# Patient Record
Sex: Male | Born: 1965 | Race: White | Hispanic: No | Marital: Married | Smoking: Never smoker
Health system: Southern US, Community
[De-identification: ages and names within clinical notes are randomized; demographics above are authoritative.]

## PROBLEM LIST (undated history)

## (undated) DIAGNOSIS — K5730 Diverticulosis of large intestine without perforation or abscess without bleeding: Secondary | ICD-10-CM

## (undated) DIAGNOSIS — K222 Esophageal obstruction: Secondary | ICD-10-CM

## (undated) DIAGNOSIS — Z8719 Personal history of other diseases of the digestive system: Secondary | ICD-10-CM

## (undated) DIAGNOSIS — K449 Diaphragmatic hernia without obstruction or gangrene: Secondary | ICD-10-CM

## (undated) DIAGNOSIS — K200 Eosinophilic esophagitis: Principal | ICD-10-CM

## (undated) DIAGNOSIS — K219 Gastro-esophageal reflux disease without esophagitis: Secondary | ICD-10-CM

## (undated) DIAGNOSIS — G43909 Migraine, unspecified, not intractable, without status migrainosus: Secondary | ICD-10-CM

## (undated) DIAGNOSIS — F411 Generalized anxiety disorder: Secondary | ICD-10-CM

## (undated) DIAGNOSIS — Z87442 Personal history of urinary calculi: Secondary | ICD-10-CM

## (undated) DIAGNOSIS — N433 Hydrocele, unspecified: Secondary | ICD-10-CM

## (undated) DIAGNOSIS — Z973 Presence of spectacles and contact lenses: Secondary | ICD-10-CM

## (undated) DIAGNOSIS — J45909 Unspecified asthma, uncomplicated: Secondary | ICD-10-CM

## (undated) HISTORY — DX: Esophageal obstruction: K22.2

## (undated) HISTORY — PX: APPENDECTOMY: SHX54

## (undated) HISTORY — PX: INGUINAL HERNIA REPAIR: SUR1180

## (undated) HISTORY — DX: Eosinophilic esophagitis: K20.0

## (undated) HISTORY — DX: Diaphragmatic hernia without obstruction or gangrene: K44.9

## (undated) HISTORY — PX: ESOPHAGEAL DILATION: SHX303

## (undated) HISTORY — PX: WISDOM TOOTH EXTRACTION: SHX21

## (undated) HISTORY — DX: Diverticulosis of large intestine without perforation or abscess without bleeding: K57.30

---

## 2005-01-29 ENCOUNTER — Ambulatory Visit (HOSPITAL_COMMUNITY): Admission: RE | Admit: 2005-01-29 | Discharge: 2005-01-29 | Payer: Self-pay | Admitting: Family Medicine

## 2005-02-24 ENCOUNTER — Ambulatory Visit (HOSPITAL_COMMUNITY): Admission: RE | Admit: 2005-02-24 | Discharge: 2005-02-24 | Payer: Self-pay | Admitting: Gastroenterology

## 2007-03-12 ENCOUNTER — Encounter: Admission: RE | Admit: 2007-03-12 | Discharge: 2007-03-12 | Payer: Self-pay | Admitting: *Deleted

## 2008-09-11 ENCOUNTER — Encounter: Admission: RE | Admit: 2008-09-11 | Discharge: 2008-09-11 | Payer: Self-pay | Admitting: Family Medicine

## 2009-07-25 ENCOUNTER — Encounter: Admission: RE | Admit: 2009-07-25 | Discharge: 2009-07-25 | Payer: Self-pay | Admitting: *Deleted

## 2009-12-22 HISTORY — PX: ESOPHAGOGASTRODUODENOSCOPY: SHX1529

## 2010-03-21 DIAGNOSIS — K200 Eosinophilic esophagitis: Secondary | ICD-10-CM | POA: Insufficient documentation

## 2010-03-21 HISTORY — DX: Eosinophilic esophagitis: K20.0

## 2010-04-15 ENCOUNTER — Emergency Department (HOSPITAL_COMMUNITY): Admission: EM | Admit: 2010-04-15 | Discharge: 2010-04-16 | Payer: Self-pay | Admitting: Emergency Medicine

## 2010-05-01 ENCOUNTER — Ambulatory Visit: Payer: Self-pay | Admitting: Otolaryngology

## 2010-05-28 ENCOUNTER — Ambulatory Visit (HOSPITAL_COMMUNITY): Admission: RE | Admit: 2010-05-28 | Discharge: 2010-05-28 | Payer: Self-pay | Admitting: Gastroenterology

## 2011-01-12 ENCOUNTER — Encounter: Payer: Self-pay | Admitting: *Deleted

## 2011-05-09 NOTE — Op Note (Signed)
NAME:  Jeffrey Henderson, Jeffrey Henderson               ACCOUNT NO.:  [id number]   MEDICAL RECORD NO.:  [id number]          PATIENT TYPE:  AMB   LOCATION:  ENDO                         FACILITY:  [facility]   PHYSICIAN:  Graylin Shiver, M.D.   DATE OF BIRTH:  Feb 02, 1966   DATE OF PROCEDURE:  02/24/2005  DATE OF DISCHARGE:                                 OPERATIVE REPORT   PROCEDURE:  Esophagogastroduodenoscopy with biopsy for CLO-test.   INDICATIONS FOR PROCEDURE:  The patient complains of heartburn and acid  reflux.   Informed consent was obtained after explanation of the risks of bleeding,  infection and perforation.   PREMEDICATION:  Fentanyl 75 mcg IV, Versed 8 mg IV.   PROCEDURE:  With the patient in the left lateral decubitus position, the  Olympus gastroscope was inserted into the oropharynx and passed into the  esophagus. It was advanced down the esophagus then into the stomach and into  the duodenum. The second portion and bulb of the duodenum looked normal. The  stomach showed some mild diffuse erythema compatible with gastritis. No  ulcers or erosions were seen. Biopsy for CLO-test was obtained. No lesions  were seen in the fundus or cardia. The esophagus revealed the  esophagogastric junction to be normal. It was located at 43 cm from the  incisor teeth. There was no evidence of erosions or ulcerations the  esophageal mucosa looked normal in its entirety. He tolerated this procedure  well without complications.   IMPRESSION:  Mild gastritis otherwise normal upper endoscopy.   I suspect that he does have some acid reflux which causes his heartburn  sensations. This seems to be responsive to the use of Pepcid [facility] which he  takes. I would recommend that he remain on this medication. The CLO test  will be checked.      SFG/MEDQ  D:  02/24/2005  T:  02/24/2005  Job:  [id number]

## 2011-05-09 NOTE — Op Note (Signed)
NAME:  Jeffrey Henderson, Jeffrey Henderson               ACCOUNT NO.:  [id number]   MEDICAL RECORD NO.:  [id number]          PATIENT TYPE:  AMB   LOCATION:  ENDO                         FACILITY:  [facility]   PHYSICIAN:  Graylin Shiver, M.D.   DATE OF BIRTH:  08-Jan-1966   DATE OF PROCEDURE:  02/24/2005  DATE OF DISCHARGE:                                 OPERATIVE REPORT   PROCEDURE:  Colonoscopy.   INDICATIONS FOR PROCEDURE:  Left upper quadrant abdominal pain described as  a crampy-like pain.  Family history of colon cancer.   CONSENT:  Informed consent was obtained after explanation of the risks of  bleeding, infection, and perforation.   PREMEDICATION:  The procedure was done after an EGD with an additional 25  mcg of fentanyl  and 2 mg of Versed given.   DESCRIPTION OF PROCEDURE:  With the patient in the left lateral decubitus  position, the rectal exam was performed.  No masses were felt.  The Olympus  colonoscope was inserted into the rectum and advanced around the colon to  the cecum.  Cecal landmarks were identified.  The colon was tortuous.  The  cecum looked normal.  The ascending colon looked normal.  The RV colon  looked normal.  The descending colon, sigmoid and rectum all looked normal.  He tolerated the procedure well without complications.   IMPRESSION:  Normal colonoscopy to the cecum.      SFG/MEDQ  D:  02/24/2005  T:  02/24/2005  Job:  [id number]   cc:   Annabell Howells, MD  Fax: [phone]

## 2011-11-28 IMAGING — CR DG CHEST 1V PORT
1 series · 1 of 1 positions shown · non-contrast
Comparison: none

REASON FOR EXAM: fb
COMMENTS:

PROCEDURE:     DXR - DXR PORTABLE CHEST SINGLE VIEW  - May 01, 2010  [DATE]
RESULT:     The lung fields are clear. The heart, mediastinal and osseous
structures are normal in appearance.

[view not recorded]
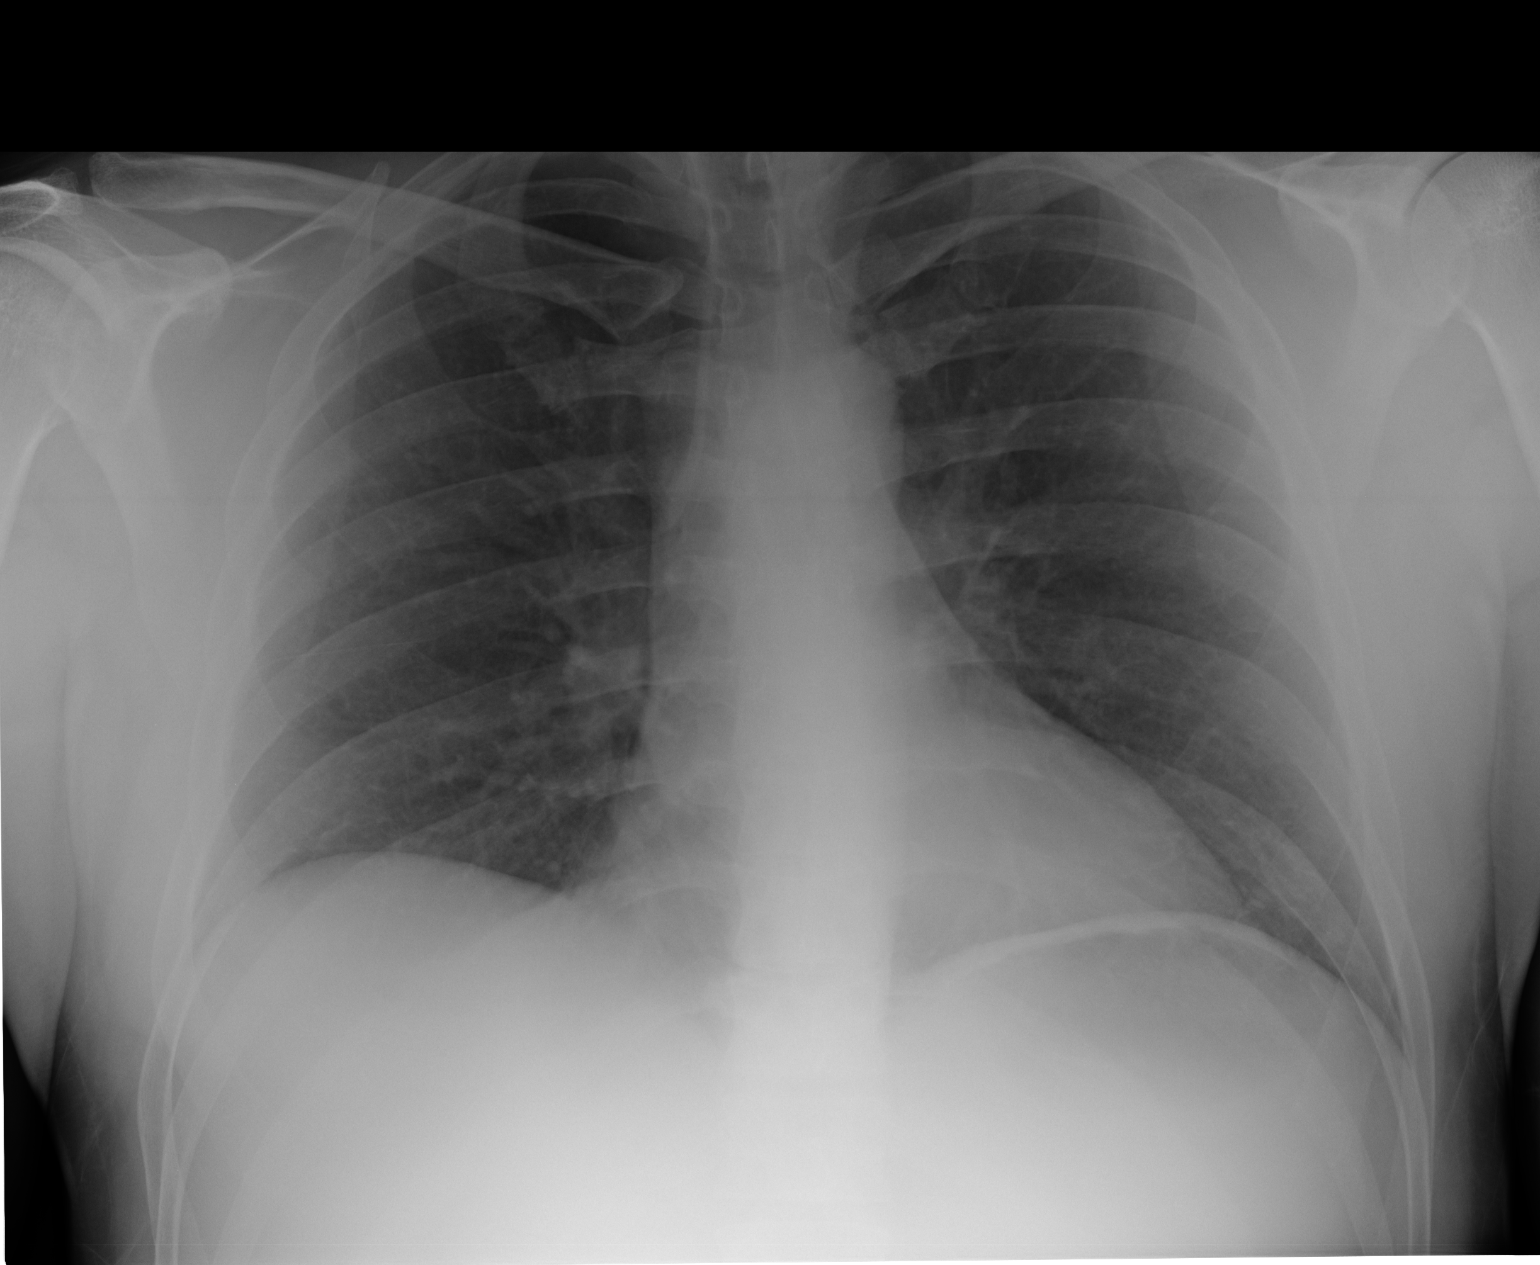

[1 of 1 positions shown; findings below may reference images not displayed]

IMPRESSION: No acute changes are identified.

## 2014-01-21 ENCOUNTER — Emergency Department (HOSPITAL_COMMUNITY): Payer: BC Managed Care – PPO

## 2014-01-21 ENCOUNTER — Emergency Department (HOSPITAL_COMMUNITY)
Admission: EM | Admit: 2014-01-21 | Discharge: 2014-01-21 | Disposition: A | Discharge status: Home / Self Care | Payer: BC Managed Care – PPO | Attending: Emergency Medicine | Admitting: Emergency Medicine

## 2014-01-21 ENCOUNTER — Encounter (HOSPITAL_COMMUNITY): Payer: Self-pay | Admitting: Emergency Medicine

## 2014-01-21 DIAGNOSIS — S139XXA Sprain of joints and ligaments of unspecified parts of neck, initial encounter: Secondary | ICD-10-CM | POA: Insufficient documentation

## 2014-01-21 DIAGNOSIS — Y9389 Activity, other specified: Secondary | ICD-10-CM | POA: Insufficient documentation

## 2014-01-21 DIAGNOSIS — IMO0002 Reserved for concepts with insufficient information to code with codable children: Secondary | ICD-10-CM

## 2014-01-21 DIAGNOSIS — S239XXA Sprain of unspecified parts of thorax, initial encounter: Secondary | ICD-10-CM | POA: Insufficient documentation

## 2014-01-21 DIAGNOSIS — Z79899 Other long term (current) drug therapy: Secondary | ICD-10-CM | POA: Insufficient documentation

## 2014-01-21 DIAGNOSIS — Y92410 Unspecified street and highway as the place of occurrence of the external cause: Secondary | ICD-10-CM | POA: Insufficient documentation

## 2014-01-21 DIAGNOSIS — S161XXA Strain of muscle, fascia and tendon at neck level, initial encounter: Secondary | ICD-10-CM

## 2014-01-21 MED ORDER — METHOCARBAMOL 500 MG PO TABS: 500.0000 mg | ORAL_TABLET | Freq: Two times a day (BID) | ORAL | Status: DC

## 2014-01-21 MED ORDER — HYDROCODONE-ACETAMINOPHEN 5-325 MG PO TABS: 1.0000 | ORAL_TABLET | ORAL | Status: DC | PRN

## 2014-01-21 MED ORDER — MORPHINE SULFATE 4 MG/ML IJ SOLN
4.0000 mg | Freq: Once | INTRAMUSCULAR | Status: AC
  Administered 2014-01-21: 4 mg via INTRAMUSCULAR
  Filled 2014-01-21: qty 1

## 2014-01-21 MED ORDER — KETOROLAC TROMETHAMINE 60 MG/2ML IM SOLN
60.0000 mg | Freq: Once | INTRAMUSCULAR | Status: AC
  Administered 2014-01-21: 60 mg via INTRAMUSCULAR
  Filled 2014-01-21: qty 2

## 2014-01-21 MED ORDER — DIAZEPAM 5 MG/ML IJ SOLN
5.0000 mg | Freq: Once | INTRAMUSCULAR | Status: AC
  Administered 2014-01-21: 5 mg via INTRAMUSCULAR
  Filled 2014-01-21: qty 2
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
## 2014-01-21 NOTE — Discharge Instructions (Signed)
1. Medications: robaxin, naproxyn or ibuprofen, vicodin, usual home medications 2. Treatment: rest, drink plenty of fluids, gentle stretching as discussed, alternate ice and heat 3. Follow Up: Please followup with your primary doctor for discussion of your diagnoses and further evaluation after today's visit; if you do not have a primary care doctor use the resource guide provided to find one;   Back Exercises Back exercises help treat and prevent back injuries. The goal of back exercises is to increase the strength of your abdominal and back muscles and the flexibility of your back. These exercises should be started when you no longer have back pain. Back exercises include:  Pelvic Tilt. Lie on your back with your knees bent. Tilt your pelvis until the lower part of your back is against the floor. Hold this position 5 to 10 sec and repeat 5 to 10 times.  Knee to Chest. Pull first 1 knee up against your chest and hold for 20 to 30 seconds, repeat this with the other knee, and then both knees. This may be done with the other leg straight or bent, whichever feels better.  Sit-Ups or Curl-Ups. Bend your knees 90 degrees. Start with tilting your pelvis, and do a partial, slow sit-up, lifting your trunk only 30 to 45 degrees off the floor. Take at least 2 to 3 seconds for each sit-up. Do not do sit-ups with your knees out straight. If partial sit-ups are difficult, simply do the above but with only tightening your abdominal muscles and holding it as directed.  Hip-Lift. Lie on your back with your knees flexed 90 degrees. Push down with your feet and shoulders as you raise your hips a couple inches off the floor; hold for 10 seconds, repeat 5 to 10 times.  Back arches. Lie on your stomach, propping yourself up on bent elbows. Slowly press on your hands, causing an arch in your low back. Repeat 3 to 5 times. Any initial stiffness and discomfort should lessen with repetition over time.  Shoulder-Lifts. Lie  face down with arms beside your body. Keep hips and torso pressed to floor as you slowly lift your head and shoulders off the floor. Do not overdo your exercises, especially in the beginning. Exercises may cause you some mild back discomfort which lasts for a few minutes; however, if the pain is more severe, or lasts for more than 15 minutes, do not continue exercises until you see your caregiver. Improvement with exercise therapy for back problems is slow.  See your caregivers for assistance with developing a proper back exercise program. Document Released: 01/15/2005 Document Revised: 03/01/2012 Document Reviewed: 10/09/2011 [facility] Patient Information 2014 [place], [place].    Emergency Department Resource Guide 1) Find a Doctor and Pay Out of Pocket Although you won't have to find out who is covered by your insurance plan, it is a good idea to ask around and get recommendations. You will then need to call the office and see if the doctor you have chosen will accept you as a new patient and what types of options they offer for patients who are self-pay. Some doctors offer discounts or will set up payment plans for their patients who do not have insurance, but you will need to ask so you aren't surprised when you get to your appointment.  2) Contact Your Local Health Department Not all health departments have doctors that can see patients for sick visits, but many do, so it is worth a call to see if yours does. If  you don't know where your local health department is, you can check in your phone book. The CDC also has a tool to help you locate your state's health department, and many state websites also have listings of all of their local health departments.  3) Find a Walk-in Clinic If your illness is not likely to be very severe or complicated, you may want to try a walk in clinic. These are popping up all over the country in pharmacies, drugstores, and shopping centers. They're usually staffed by  nurse practitioners or physician assistants that have been trained to treat common illnesses and complaints. They're usually fairly quick and inexpensive. However, if you have serious medical issues or chronic medical problems, these are probably not your best option.  No Primary Care Doctor: - Call Health Connect at  [phone] - they can help you locate a primary care doctor that  accepts your insurance, provides certain services, etc. - Physician Referral Service- [phone]  Chronic Pain Problems: Organization         Address  Phone   Notes  [facility]  [phone] Patients need to be referred by their primary care doctor.   Medication Assistance: Organization         Address  Phone   Notes  [facility] [address] [place], [place] [address] [phone] --Must be a resident of [facility] [facility] -- Must have NO insurance coverage whatsoever (no Medicaid/ Medicare, etc.) -- The pt. MUST have a primary care doctor that directs their care regularly and follows them in the community   [facility]ist  [phone]   [employer]  [phone]    Agencies that provide inexpensive medical care: Organization         Address  Phone   Notes  [facility]  [phone]) [phone]   [facility]    [phone]   [facility] [address] [place], [place] [address] [phone]) [phone]   [facility] [address], [place] [phone]) [phone]   [facility]anned Parenthood    [phone]   [facility]    [phone]   [facility]  [address], [place] Phone:  [phone], Fax:  [phone]) [phone] Hours of Operation:  9 am - 6 pm, M-F.  Also accepts Medicaid/Medicare and self-pay.  [facility]  [address], [place] Phone: [phone], Fax: [phone]. Hours of Operation:  8:30  am - 5:30 pm, M-F.  Also accepts Medicaid and self-pay.  [facility] [place] [address], [place] Phone: [phone]) [phone]   [facility] [address], [place], [place] [phone], Ext. [phone] Mondays & Thursdays: 7-9 AM.  First 15 patients are seen on a first come, first serve basis.    Medicaid-accepting [facility] Providers:  Organization         Address  Phone   Notes  [facility] [address], [place] [phone] Also accepts self-pay patients.  [facility] [address], [place]  [phone]) [phone]   [facility] [address], [place] [phone]   [facility] [address] [place] [address], [place] [phone]   Renaye RakersVeita Bland [address], [place]   [phone] Only accepts  [place] Access Medicaid patients after they have their name applied to their card.   Self-Pay (no insurance) in [facility]:  Organization         Address  Phone   Notes  Sickle Cell Patients, [facility] [address] [place], [place] [phone]   [facility] [address] [place], [place] [phone]   [facility]  [address], [place] [phone]   [facility]/Dr. Osei-Bonsu  [address] [place] or [address], [place] [phone] Phone number for both [place] and [place] locations is the same.  [facility] [address] [place] [phone]   [facility] [address], [place] or [address] [phone]   [facility] [address], [place] [phone], phone; [phone], fax Sees patients 1st and 3rd Saturday of every month.  Must not qualify for public or private insurance (i.e. Medicaid, Medicare, [facility]  Choice, Veterans' Benefits)  Household income should be no more than 200% of the poverty level The clinic cannot treat you if you are pregnant or think you are pregnant  Sexually transmitted diseases are not treated at the clinic.    Dental Care: Organization         Address  Phone  Notes  [facility] Department of [facility] [address] [place], [place] [phone] Accepts children up to age 44 who are enrolled in [place] or [place] Health Choice; pregnant women with a Medicaid card; and children who have applied for Medicaid or [place] Health Choice, but were declined, whose parents can pay a reduced fee at time of service.  [facility]  [address], [place] [phone] Accepts children up to age 33 who are enrolled in [place] or [place] Health Choice; pregnant women with a Medicaid card; and children who have applied for Medicaid or [place] Health Choice, but were declined, whose parents can pay a reduced fee at time of service.  [facility]  [address] [place], [place] [phone] Patients are seen by appointment only. Walk-ins are not accepted. [facility] will see patients 85 years of age and older. Monday - Tuesday (8am-5pm) Most Wednesdays (8:30-5pm) $30 per visit, cash only  [facility]  [address], [facility] [phone] Patients are seen by appointment only. Walk-ins are not accepted. [facility] will see patients 40 years of age and older. One Wednesday Evening (Monthly: Volunteer Based).  $30 per visit, cash only  [employer]  [phone] for adults; Children under age 35, call Graduate Pediatric Dentistry at [phone]. Children aged 53-14, please call [phone] to request a pediatric application.  Dental services are provided in all areas of dental care including fillings, crowns and bridges, complete  and partial dentures, implants, gum treatment, root canals, and extractions. Preventive care is also provided. Treatment is provided to both adults and children. Patients are selected via a lottery and there is often a waiting list.   [facility] [address] [place]  [phone] [email or web address]   [facility] [address] [place], [place] [phone], Ext. [phone] Second and Fourth Thursday of each month, opens at 6:30 AM; Clinic ends at  AM.  Patients are seen on a first-come first-served basis, and a limited number are seen during each clinic.  ° °[facility] ° [address], [place], [place] [phone]   Eligibility Requirements °You must have lived in [place], [place], or [place] counties for at least the last three months. °  You cannot be eligible for state or federal sponsored healthcare insurance, including Veterans Administration, Medicaid, or Medicare. °  You generally cannot be eligible for healthcare insurance through your employer.  °  How to apply: °Eligibility screenings are held every Tuesday and Wednesday afternoon from 1:00 pm until 4:00 pm. You do not need an appointment for the interview!  °[facility] [address], [place], [place] [phone]   °[facility]  [phone]   °[facility]  336-703-3100   °[facility]  336-570-6415   ° °Behavioral Health Resources in the Community: °Intensive Outpatient Programs °Organization         Address  Phone  Notes  °[facility] [address], [place], [place] [phone]   °[facility] [address], [place], [place] [phone]   °[facility] [address], [place], [place] ° 336-882-2125   °[facility] [address],  °[place], [place] 1-800-853-5163 or 336-641-4981   °Substance Abuse Resources °Organization          Address  Phone  Notes  °[facility]  336-882-2125   °[facility]  336-784-9470   °[facility]  336-285-9073   °[facility]  336-845-3988   °Residential & Outpatient Substance Abuse Program  1-800-659-3381   °Psychological Services °Organization         Address  Phone  Notes  °[facility]  336- 832-9600   °[facility]  336- 378-7881   °[facility] [address], [place] 1-800-853-5163 or 336-641-4981   ° °Mobile Crisis Teams °Organization         Address  Phone  Notes  °[facility], Mobile Crisis Care Unit  1-877-626-1772   °Assertive °[facility]otherapeutic Services ° [address] [place], [place] 336-834-9664   °Sharon DeEsch [address] °[place] 336-554-5454   ° °Self-Help/Support Groups °Organization         Address  Phone             Notes  °[facility] [place] - variety of support groups  336- 373-1402 Call for more information  °Narcotics Anonymous (NA), [facility] [address], °[place]  2 meetings at this location  ° °Residential Treatment Programs °Organization         Address  Phone  Notes  °[facility] [address],    °[place]  1-866-801-8205   °[facility] ° [address], [place], [place] 704-293-8524   °[facility] [address], [place] 336-845-3988 Admissions: 8am-3pm M-F  °[facility] [address]    °[place], [place] 336-841-1104   °[facility] [address], [place], [place] 336-379-7146   °[facility] [address]  °[place], [place] 336-285-9073   °[facility] - Intensive Outpatient [address], [place], [place] 336-852-3033   °[facility] ([facility] [address]  °[place], [place] 1-877-615-2722 or 336-784-9470   °[facility] ([facility])   [facility]) [address] [place], [place]  [phone] Accepts Medicaid  [facility] [address]  [place] [phone] Substance Abuse/Addiction Treatment   [facility]al Health Resources Organization         Address  Phone  Notes  [facility]  [phone]   Angie FavaJulie Brannon, PhD [address], [address] [place], [place]   [phone]) [phone] or [phone](336) [phone]   [facility]   [address] [place], [place] [phone](336) [phone]   [facility] [address], [place], [place] [phone] Insurance/Medicaid/sponsorship through [facility]  [facility] [address]                                    [place], [place] [phone] Therapy/tele-psych/case  [facility] [address]   [place], [place] [phone](336) [phone]    Dr. Lolly MustacheArfeen  [phone]   [facility]  [facility] 1) [address], [place] 2) [address], [place] 3)  [address], [place] [phone]  [phone]   [facility] Child Abuse Hotline [phone](336) [phone] or [phone](336) [phone] (After Hours)

## 2014-01-22 NOTE — ED Provider Notes (Signed)
Medical screening examination/treatment/procedure(s) were performed by non-physician practitioner and as supervising physician I was immediately available for consultation/collaboration.  EKG Interpretation   None         Lyanne CoKevin M Zully Frane, MD 01/22/14 [phone]

## 2015-02-05 ENCOUNTER — Ambulatory Visit (HOSPITAL_BASED_OUTPATIENT_CLINIC_OR_DEPARTMENT_OTHER)
Admission: RE | Admit: 2015-02-05 | Discharge: 2015-02-05 | Disposition: A | Discharge status: Home / Self Care | Payer: BLUE CROSS/BLUE SHIELD | Source: Ambulatory Visit | Attending: Family Medicine | Admitting: Family Medicine

## 2015-02-05 ENCOUNTER — Ambulatory Visit: Payer: Self-pay | Admitting: Sports Medicine

## 2015-02-05 ENCOUNTER — Encounter: Payer: Self-pay | Admitting: Family Medicine

## 2015-02-05 ENCOUNTER — Ambulatory Visit (INDEPENDENT_AMBULATORY_CARE_PROVIDER_SITE_OTHER): Payer: BLUE CROSS/BLUE SHIELD | Admitting: Family Medicine

## 2015-02-05 VITALS — BP 134/83 | HR 89 | Ht 71.0 in | Wt 190.0 lb

## 2015-02-05 DIAGNOSIS — M545 Low back pain, unspecified: Secondary | ICD-10-CM

## 2015-02-05 DIAGNOSIS — M542 Cervicalgia: Secondary | ICD-10-CM

## 2015-02-05 MED ORDER — PREDNISONE (PAK) 10 MG PO TABS: ORAL_TABLET | ORAL | Status: DC

## 2015-02-05 MED ORDER — TRAMADOL HCL 50 MG PO TABS: 50.0000 mg | ORAL_TABLET | Freq: Four times a day (QID) | ORAL | Status: DC | PRN

## 2015-02-05 NOTE — Patient Instructions (Signed)
Get x-rays as you leave today. We will contact you with these results and try to get an MRI approved for your low back. A prednisone dose pack is the best option for immediate relief and may be prescribed. Day after finishing prednisone start aleve 2 tabs a day with food for pain and inflammation. Tramadol as needed for severe pain (no driving on this medicine). Stay as active as possible. Physical therapy has been shown to be helpful as well. Strengthening of low back muscles, abdominal musculature are key for long term pain relief.

## 2015-02-07 DIAGNOSIS — M545 Low back pain, unspecified: Secondary | ICD-10-CM | POA: Insufficient documentation

## 2015-02-07 DIAGNOSIS — M542 Cervicalgia: Secondary | ICD-10-CM | POA: Insufficient documentation
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
## 2015-02-07 NOTE — Assessment & Plan Note (Signed)
2/2 cervical strain.  Discussed options and he would like to try prednisone then switch to nsaids.  Tramadol as needed.  Consider physical therapy.

## 2015-02-07 NOTE — Assessment & Plan Note (Signed)
worse than his neck pain.  Radiographs negative.  Definitely has lumbar strain component but concern for disc herniation given severity.  He would like to go ahead with MRI and possibly ESI if herniation seen on MRI coupled with physical therapy

## 2015-02-10 ENCOUNTER — Ambulatory Visit (HOSPITAL_BASED_OUTPATIENT_CLINIC_OR_DEPARTMENT_OTHER): Payer: BLUE CROSS/BLUE SHIELD

## 2015-07-11 ENCOUNTER — Other Ambulatory Visit: Payer: Self-pay | Admitting: Family

## 2015-07-11 DIAGNOSIS — R1012 Left upper quadrant pain: Secondary | ICD-10-CM

## 2015-07-17 ENCOUNTER — Ambulatory Visit
Admission: RE | Admit: 2015-07-17 | Discharge: 2015-07-17 | Disposition: A | Discharge status: Home / Self Care | Payer: BLUE CROSS/BLUE SHIELD | Source: Ambulatory Visit | Attending: Family | Admitting: Family

## 2015-07-17 ENCOUNTER — Other Ambulatory Visit: Payer: Self-pay | Admitting: Family

## 2015-07-17 DIAGNOSIS — R1012 Left upper quadrant pain: Secondary | ICD-10-CM

## 2016-01-15 ENCOUNTER — Other Ambulatory Visit: Payer: Self-pay | Admitting: Surgery

## 2016-01-15 DIAGNOSIS — R1032 Left lower quadrant pain: Secondary | ICD-10-CM

## 2016-01-21 ENCOUNTER — Other Ambulatory Visit: Payer: BLUE CROSS/BLUE SHIELD

## 2016-01-24 ENCOUNTER — Ambulatory Visit
Admission: RE | Admit: 2016-01-24 | Discharge: 2016-01-24 | Disposition: A | Discharge status: Home / Self Care | Payer: BLUE CROSS/BLUE SHIELD | Source: Ambulatory Visit | Attending: Surgery | Admitting: Surgery

## 2016-01-24 DIAGNOSIS — R1032 Left lower quadrant pain: Secondary | ICD-10-CM

## 2016-01-24 MED ORDER — IOPAMIDOL (ISOVUE-300) INJECTION 61%
100.0000 mL | Freq: Once | INTRAVENOUS | Status: AC | PRN
Start: 2016-01-24 — End: 2016-01-24
  Administered 2016-01-24: 100 mL via INTRAVENOUS

## 2016-03-17 ENCOUNTER — Other Ambulatory Visit: Payer: Self-pay | Admitting: Surgery

## 2016-03-17 DIAGNOSIS — K5732 Diverticulitis of large intestine without perforation or abscess without bleeding: Secondary | ICD-10-CM

## 2016-03-19 ENCOUNTER — Ambulatory Visit
Admission: RE | Admit: 2016-03-19 | Discharge: 2016-03-19 | Disposition: A | Discharge status: Home / Self Care | Payer: BLUE CROSS/BLUE SHIELD | Source: Ambulatory Visit | Attending: Surgery | Admitting: Surgery

## 2016-03-19 DIAGNOSIS — K5732 Diverticulitis of large intestine without perforation or abscess without bleeding: Secondary | ICD-10-CM

## 2016-03-19 MED ORDER — IOPAMIDOL (ISOVUE-300) INJECTION 61%
100.0000 mL | Freq: Once | INTRAVENOUS | Status: AC | PRN
  Administered 2016-03-19: 100 mL via INTRAVENOUS

## 2016-03-21 ENCOUNTER — Other Ambulatory Visit (HOSPITAL_COMMUNITY): Payer: Self-pay | Admitting: Surgery

## 2016-03-21 ENCOUNTER — Other Ambulatory Visit: Payer: Self-pay | Admitting: Surgery

## 2016-03-21 ENCOUNTER — Encounter: Payer: Self-pay | Admitting: Surgery

## 2016-03-21 DIAGNOSIS — K222 Esophageal obstruction: Secondary | ICD-10-CM

## 2016-03-21 DIAGNOSIS — K5730 Diverticulosis of large intestine without perforation or abscess without bleeding: Secondary | ICD-10-CM | POA: Insufficient documentation

## 2016-03-21 DIAGNOSIS — K200 Eosinophilic esophagitis: Secondary | ICD-10-CM

## 2016-03-21 DIAGNOSIS — K449 Diaphragmatic hernia without obstruction or gangrene: Secondary | ICD-10-CM

## 2016-03-21 DIAGNOSIS — R10814 Left lower quadrant abdominal tenderness: Secondary | ICD-10-CM

## 2016-03-21 HISTORY — DX: Esophageal obstruction: K22.2

## 2016-03-21 HISTORY — DX: Diverticulosis of large intestine without perforation or abscess without bleeding: K57.30

## 2016-03-21 HISTORY — DX: Diaphragmatic hernia without obstruction or gangrene: K44.9

## 2016-05-13 ENCOUNTER — Other Ambulatory Visit: Payer: Self-pay | Admitting: Family Medicine

## 2016-05-13 ENCOUNTER — Ambulatory Visit
Admission: RE | Admit: 2016-05-13 | Discharge: 2016-05-13 | Disposition: A | Discharge status: Home / Self Care | Payer: BLUE CROSS/BLUE SHIELD | Source: Ambulatory Visit | Attending: Family Medicine | Admitting: Family Medicine

## 2016-05-13 DIAGNOSIS — M549 Dorsalgia, unspecified: Secondary | ICD-10-CM

## 2016-05-13 DIAGNOSIS — M542 Cervicalgia: Secondary | ICD-10-CM

## 2016-05-16 ENCOUNTER — Other Ambulatory Visit: Payer: Self-pay | Admitting: Family Medicine

## 2016-05-16 DIAGNOSIS — M546 Pain in thoracic spine: Secondary | ICD-10-CM

## 2016-05-16 DIAGNOSIS — M542 Cervicalgia: Secondary | ICD-10-CM

## 2016-05-22 ENCOUNTER — Other Ambulatory Visit: Payer: BLUE CROSS/BLUE SHIELD

## 2017-06-21 DIAGNOSIS — K7689 Other specified diseases of liver: Secondary | ICD-10-CM

## 2017-06-21 HISTORY — DX: Other specified diseases of liver: K76.89

## 2018-07-03 ENCOUNTER — Emergency Department (HOSPITAL_COMMUNITY)
Admission: EM | Admit: 2018-07-03 | Discharge: 2018-07-03 | Disposition: A | Discharge status: Home / Self Care | Payer: BLUE CROSS/BLUE SHIELD | Attending: Emergency Medicine | Admitting: Emergency Medicine

## 2018-07-03 ENCOUNTER — Other Ambulatory Visit: Payer: Self-pay

## 2018-07-03 ENCOUNTER — Emergency Department (HOSPITAL_COMMUNITY): Payer: BLUE CROSS/BLUE SHIELD

## 2018-07-03 ENCOUNTER — Encounter (HOSPITAL_COMMUNITY): Payer: Self-pay

## 2018-07-03 DIAGNOSIS — Z87891 Personal history of nicotine dependence: Secondary | ICD-10-CM | POA: Insufficient documentation

## 2018-07-03 DIAGNOSIS — Z79899 Other long term (current) drug therapy: Secondary | ICD-10-CM | POA: Insufficient documentation

## 2018-07-03 DIAGNOSIS — K5900 Constipation, unspecified: Secondary | ICD-10-CM

## 2018-07-03 NOTE — Discharge Instructions (Signed)
It was our pleasure to provide your ER care today - we hope that you feel better.  Drink plenty of fluids. Get plenty of fiber in diet. Take colace (stool softener) 2x/day, and use miralax (laxative) once daily as need  - both of these two medications are available over the counter.   Follow up with primary care doctor Monday if symptoms fail to improve/resolve.  Return to ER if worse, new symptoms, fevers, worsening or severe abdominal pani, persistent vomiting, other concern.

## 2018-07-03 NOTE — ED Provider Notes (Signed)
[facility]-EMERGENCY DEPT Provider Note   CSN: [id number] Arrival date & time: 07/03/18  1220     History   Chief Complaint Chief Complaint  Patient presents with  . Constipation    HPI Alex GardenerBrian K Dubray is a 52 y.o. male.  Patient c/o constipation in the past several days. Has had small bm yesterday, but no good/normal bm for 4 days. Symptoms moderate, persistent.  No abd distension. No vomiting. Is passing gas. Only prior abd surgery is remote hx appendectomy. Feels as if has to have bm, but denies focal or continuous abd pain. Normal appetite.   The history is provided by the patient.  Constipation   Pertinent negatives include no dysuria.    Past Medical History:  Diagnosis Date  . Diverticulosis of sigmoid colon 03/21/2016  . Eosinophilic esophagitis 03/21/2010   [facility].  Treated with oral fluticasone therapy     . Hiatal hernia 03/21/2016  . Schatzki's ring of distal esophagus 03/21/2016    Patient Active Problem List   Diagnosis Date Noted  . Hiatal hernia 03/21/2016  . Schatzki's ring of distal esophagus 03/21/2016  . Diverticulosis of sigmoid colon 03/21/2016  . Low back pain 02/07/2015  . Neck pain 02/07/2015  . Eosinophilic esophagitis 03/21/2010    Past Surgical History:  Procedure Laterality Date  . APPENDECTOMY          Home Medications    Prior to Admission medications   Medication Sig Start Date End Date Taking? Authorizing Provider  LORazepam (ATIVAN) 1 MG tablet TAKE ONE TABLET (1 MG DOSE) BY MOUTH DAILY AS NEEDED FOR ANXIETY. 06/10/18  Yes [provider]  omeprazole (PRILOSEC) 20 MG capsule Take 20 mg by mouth daily.   Yes [provider]  Probiotic Product (PROBIOTIC PO) Take 1 capsule by mouth daily.   Yes [provider]  traZODone (DESYREL) 100 MG tablet TAKE 1 TABLET BY MOUTH EVERYDAY AT BEDTIME 04/09/18  Yes [provider]  vitamin B-12 (CYANOCOBALAMIN) 1000 MCG tablet Take 1,000 mcg  by mouth daily.   Yes [provider]  predniSONE (STERAPRED UNI-PAK) 10 MG tablet 6 tabs po day 1, 5 tabs po day 2, 4 tabs po day 3, 3 tabs po day 4, 2 tabs po day 5, 1 tab po day 6 Patient not taking: Reported on 07/03/2018 02/05/15   Lenda KelpHudnall, Shane R, MD  traMADol (ULTRAM) 50 MG tablet Take 1 tablet (50 mg total) by mouth every 6 (six) hours as needed. Patient not taking: Reported on 07/03/2018 02/05/15   Lenda KelpHudnall, Shane R, MD    Family History No family history on file.  Social History Social History   Tobacco Use  . Smoking status: Never Smoker  . Smokeless tobacco: Former Engineer, waterUser  Substance Use Topics  . Alcohol use: Yes    Comment: social  . Drug use: No     Allergies   Codeine   Review of Systems Review of Systems  Constitutional: Negative for fever.  HENT: Negative for sore throat.   Respiratory: Negative for cough.   Cardiovascular: Negative for chest pain.  Gastrointestinal: Positive for constipation. Negative for vomiting.  Genitourinary: Negative for dysuria and flank pain.     Physical Exam Updated Vital Signs BP (!) 143/84 (BP Location: Left Arm)   Pulse 83   Resp 18   Ht 1.803 m (5\' 11" )   Wt 90.7 kg (200 lb)   SpO2 96%   BMI 27.89 kg/m   Physical Exam  Constitutional: He is oriented to person, place, and time. He appears well-developed and well-nourished.  HENT:  Mouth/Throat: Oropharynx is clear and moist.  Eyes: Conjunctivae are normal.  Neck: Neck supple. No tracheal deviation present.  Cardiovascular: Normal rate, regular rhythm, normal heart sounds and intact distal pulses. Exam reveals no gallop and no friction rub.  No murmur heard. Pulmonary/Chest: Effort normal and breath sounds normal. No accessory muscle usage. No respiratory distress.  Abdominal: Soft. Bowel sounds are normal. He exhibits no distension and no mass. There is no tenderness. There is no rebound and no guarding. No hernia.  Genitourinary:  Genitourinary Comments:  No cva tenderness  Musculoskeletal: He exhibits no edema.  Neurological: He is alert and oriented to person, place, and time.  Skin: Skin is warm and dry.  Psychiatric: He has a normal mood and affect.  Nursing note and vitals reviewed.    ED Treatments / Results  Labs (all labs ordered are listed, but only abnormal results are displayed) Labs Reviewed - No data to display  EKG None  Radiology Dg Abd 1 View  Result Date: 07/03/2018 CLINICAL DATA:  52 year old male with history of constipation for the past 3-4 days. Increasing abdominal pain and nausea. EXAM: ABDOMEN - 1 VIEW COMPARISON:  No priors. FINDINGS: Gas and stool are seen scattered throughout the colon extending to the level of the distal rectum. No pathologic distension of small bowel is noted. No gross evidence of pneumoperitoneum. Surgical clips in the right lower quadrant likely from prior appendectomy. IMPRESSION: 1. Nonobstructive bowel gas pattern. 2. No pneumoperitoneum. Electronically Signed   By: Trudie Reed M.D.   On: 07/03/2018 14:15    Procedures Procedures (including critical care time)  Medications Ordered in ED Medications - No data to display   Initial Impression / Assessment and Plan / ED Course  I have reviewed the triage vital signs and the nursing notes.  Pertinent labs & imaging results that were available during my care of the patient were reviewed by me and considered in my medical decision making (see chart for details).  xrays ordered.  Reviewed nursing notes and prior charts for additional history.   No vomiting. No fevers. abd exam benign. xrays reviewed - no obstruction.  Will rec colace and miralax.   Return precautions provided.     Final Clinical Impressions(s) / ED Diagnoses   Final diagnoses:  None    ED Discharge Orders    None       Cathren Laine, MD 07/03/18 [phone]

## 2018-07-03 NOTE — ED Triage Notes (Signed)
Pt has not had bm since Tuesday.  Has tried home remedies without relief.  Extreme abdominal pain.  Hx of diverticulitis.  Nausea with no vomiting.

## 2019-04-12 ENCOUNTER — Other Ambulatory Visit: Payer: Self-pay | Admitting: Urology

## 2019-05-05 ENCOUNTER — Other Ambulatory Visit: Payer: Self-pay | Admitting: Urology

## 2019-05-21 ENCOUNTER — Other Ambulatory Visit: Payer: Self-pay

## 2019-05-21 ENCOUNTER — Encounter (HOSPITAL_BASED_OUTPATIENT_CLINIC_OR_DEPARTMENT_OTHER): Payer: Self-pay

## 2019-05-21 NOTE — Progress Notes (Signed)
Spoke with:  Arlys John NPO:  After Midnight, no gum, candy, or mints   Arrival time:  0715AM Labs: None (COVID 05/23/2019 in epic) AM medications: Omeprazole, Lorazepam if needed Pre op orders: Yes Ride home: Aram Beecham (wife) [phone]

## 2019-05-21 NOTE — Progress Notes (Signed)
SPOKE W/  Shon     SCREENING SYMPTOMS OF COVID 19:   COUGH--NO  RUNNY NOSE--- NO  SORE THROAT---NO  NASAL CONGESTION----NO  SNEEZING----NO  SHORTNESS OF BREATH---NO  DIFFICULTY BREATHING---NO  TEMP >100.0 -----NO  UNEXPLAINED BODY ACHES------NO  CHILLS -------- NO  HEADACHES ---------NO  LOSS OF SMELL/ TASTE --------NO    HAVE YOU OR ANY FAMILY MEMBER TRAVELLED PAST 14 DAYS OUT OF THE   COUNTY---NO STATE----NO COUNTRY----NO  HAVE YOU OR ANY FAMILY MEMBER BEEN EXPOSED TO ANYONE WITH COVID 19? NO    

## 2019-05-23 ENCOUNTER — Other Ambulatory Visit (HOSPITAL_COMMUNITY)
Admission: RE | Admit: 2019-05-23 | Discharge: 2019-05-23 | Disposition: A | Discharge status: Home / Self Care | Payer: BC Managed Care – PPO | Source: Ambulatory Visit | Attending: Urology | Admitting: Urology

## 2019-05-23 ENCOUNTER — Other Ambulatory Visit: Payer: Self-pay

## 2019-05-23 DIAGNOSIS — Z1159 Encounter for screening for other viral diseases: Secondary | ICD-10-CM | POA: Diagnosis present

## 2019-05-25 NOTE — Progress Notes (Signed)
SPOKE W/ PATIENT.     SCREENING SYMPTOMS OF COVID 19:   COUGH-- NO  RUNNY NOSE--- NO  SORE THROAT--- NO  NASAL CONGESTION---- NO  SNEEZING---- NO  SHORTNESS OF BREATH--- NO  DIFFICULTY BREATHING--- NO  TEMP >100.0 ----- NO  UNEXPLAINED BODY ACHES------ NO  CHILLS -------- NO   HEADACHES --------- NO  LOSS OF SMELL/ TASTE -------- NO    HAVE YOU OR ANY FAMILY MEMBER TRAVELLED PAST 14 DAYS OUT OF THE   COUNTY--- NO STATE---- NO COUNTRY---- NO  HAVE YOU OR ANY FAMILY MEMBER BEEN EXPOSED TO ANYONE WITH COVID 19? NO  Cheston Coury J Verlin Duke, RN     

## 2019-05-25 NOTE — H&P (Signed)
CC: I have swelling in my scrotum.  HPI: Jeffrey Henderson is a 53 year-old male established patient who is here for scrotal swelling.  His hydrocele is on the right side. He does have pain on the side of his hydrocele. His hydrocele does cause restriction of normal activities.   He has had injuries to the testicles or scrotum. He has had the following surgeries: Hernia Repair. The patient denies having the following scrotal surgeries: Vasectomy, Hydrocele Repair, Undescended Testis Surgery, and Varicocele Repair. He has not had a testicular infection.   05/23/19: Jeffrey Henderson returns today in f/u. He reports the hydrocele has recurred. He is on the schedule for 6/4 for the hydrocelectomy. he has no associated signs or symptoms.   04/11/19: Jeffrey Henderson returns today for right hydrocele aspiration.   03/30/19: Jeffrey Henderson is a former patient who returns with a progressively enlarging right hydrocele that is impacting activity. He has had it for several years and is now having some discomfort with it. It is extending up into the groin. He is voiding with some chronic frequency and nocturia x 2 but no new complaints. He has no associated signs or symptoms.   04/08/19: Patient states he has been experiencing worsening right sided testicular pain. Worse when he is walking and lying down. He has been taking OTC advil and aleve with no improvement.   He was seen on the 14th for LLQ abdominal pain that resulted after doing some strenuous activity related to chainsaw use. He had a non-contrasted CT abd/pelvis performed which showed no concerning abnormality. Pain was thought to be from a groin strain r/t physical activity.   Reports worsening pain and discomfort in the right testicle over the past 36 hours. Worse with activity and lying down. He states testicle feels like it is drawn up into his abdominal cavity. Associated with nausea last night and refractory to any NSAID use. Not associated with new or worsening lower urinary tract  symptoms. Denies fevers or chills. No flank or lower back pain. He states his left-sided lower abdominal and groin pain has improved since last evaluation. Patient very adamant about having his hydrocele repaired as soon as possible today. He does admit to pain being a lot more tolerable in less severe this morning compared to last night.     ALLERGIES: Codeine Derivatives    MEDICATIONS: Omeprazole 20 mg tablet, delayed release  Lorazepam  Trazodone Hcl 100 mg tablet  Vitamin B Complex  Vitamin C     GU PSH: Aspiration of Hydrocele - 04/11/2019      PSH Notes: Inguinal Hernia Repair, Appendectomy   NON-GU PSH: Appendectomy - 2008 Hernia Repair    GU PMH: Right testicular pain - 04/11/2019 Hydrocele, Right - 04/08/2019, Right, He has a symptomatic right hydrocele and would like surgical correction. I reviewed the risks of bleeding, infection, testicular injury, recurrence, chronic pain, thrombotic events and anesthetic complications and we will get him set up as soon as the OR hold lifts. , - 03/30/2019 Abdominal Pain Unspec - 04/05/2019      PMH Notes:  1898-12-22 00:00:00 - Note: Normal Routine History And Physical Adult   NON-GU PMH: Personal history of other diseases of the digestive system, History of esophageal reflux - 2014 Anxiety Asthma GERD Hepatitis A Sleep Apnea    FAMILY HISTORY: Kidney Stones - Runs in Family Prostate Cancer - Runs in Family    Notes: 1 daughter   SOCIAL HISTORY: Marital Status: Married Preferred Language: English; Ethnicity: Not Hispanic Or  Latino; Race: White Current Smoking Status: Patient has never smoked.   Tobacco Use Assessment Completed: Used Tobacco in last 30 days? Does not use smokeless tobacco. Drinks 2 drinks per day.  Drinks 2 caffeinated drinks per day.     Notes: Occupation:, Marital History - Currently Married, Alcohol Use, Caffeine Use   REVIEW OF SYSTEMS:    GU Review Male:   Patient denies frequent urination, hard to  postpone urination, burning/ pain with urination, get up at night to urinate, leakage of urine, stream starts and stops, trouble starting your stream, have to strain to urinate , erection problems, and penile pain.  Gastrointestinal (Upper):   Patient denies nausea, vomiting, and indigestion/ heartburn.  Gastrointestinal (Lower):   Patient denies constipation and diarrhea.  Constitutional:   Patient denies fever, night sweats, weight loss, and fatigue.  Skin:   Patient denies skin rash/ lesion and itching.  Eyes:   Patient denies blurred vision and double vision.  Ears/ Nose/ Throat:   Patient denies sore throat and sinus problems.  Hematologic/Lymphatic:   Patient denies swollen glands and easy bruising.  Cardiovascular:   Patient denies leg swelling and chest pains.  Respiratory:   Patient denies cough and shortness of breath.  Endocrine:   Patient denies excessive thirst.  Musculoskeletal:   Patient denies back pain and joint pain.  Neurological:   Patient denies headaches and dizziness.  Psychologic:   Patient denies depression and anxiety.   VITAL SIGNS:      05/23/2019 08:57 AM  BP 124/81 mmHg  Pulse 63 /min  Temperature 97.7 F / 36.5 C   MULTI-SYSTEM PHYSICAL EXAMINATION:    Constitutional: Well-nourished. No physical deformities. Normally developed. Good grooming.   Respiratory: Normal breath sounds. No labored breathing, no use of accessory muscles.   Cardiovascular: Regular rate and rhythm. No murmur, no gallop.      PAST DATA REVIEWED:  Source Of History:  Patient   PROCEDURES:          Urinalysis Dipstick Dipstick Cont'd  Color: Yellow Bilirubin: Neg mg/dL  Appearance: Clear Ketones: Neg mg/dL  Specific Gravity: 0.272 Blood: Neg ery/uL  pH: 5.5 Protein: Neg mg/dL  Glucose: Neg mg/dL Urobilinogen: 0.2 mg/dL    Nitrites: Neg    Leukocyte Esterase: Neg leu/uL    ASSESSMENT:      ICD-10 Details  1 GU:   Hydrocele - N43.0 Worsening - His hydrocele refilled in 3  days. He is to have hydrocelectomy on 6/4. He is aware of the risks.   2   Right testicular pain - N50.811 He doesn't report much pain.    PLAN:           Schedule Return Visit/Planned Activity: Keep Scheduled Appointment          Document Letter(s):  Created for Patient: Clinical Summary         Notes:   CC: Dr. Tracey Harries        Next Appointment:      Next Appointment: 05/26/2019 09:15 AM    Appointment Type: Surgery     Location: [facility] [phone]    Provider: Bjorn Pippin, M.D.    Reason for Visit: OP NE RT HYDROCELECTOMY

## 2019-05-25 NOTE — Progress Notes (Signed)
SPOKE W/  _Left message to return call to complete screening.      SCREENING SYMPTOMS OF COVID 19:   COUGH--  RUNNY NOSE---   SORE THROAT---  NASAL CONGESTION----  SNEEZING----  SHORTNESS OF BREATH---  DIFFICULTY BREATHING---  TEMP >100.0 -----  UNEXPLAINED BODY ACHES------  CHILLS --------   HEADACHES ---------  LOSS OF SMELL/ TASTE --------    HAVE YOU OR ANY FAMILY MEMBER TRAVELLED PAST 14 DAYS OUT OF THE   COUNTY--- STATE---- COUNTRY----  HAVE YOU OR ANY FAMILY MEMBER BEEN EXPOSED TO ANYONE WITH COVID 19?     

## 2019-05-26 ENCOUNTER — Ambulatory Visit (HOSPITAL_BASED_OUTPATIENT_CLINIC_OR_DEPARTMENT_OTHER)
Admission: RE | Admit: 2019-05-26 | Discharge: 2019-05-26 | Disposition: A | Discharge status: Home / Self Care | Payer: BC Managed Care – PPO | Attending: Urology | Admitting: Urology

## 2019-05-26 ENCOUNTER — Encounter (HOSPITAL_BASED_OUTPATIENT_CLINIC_OR_DEPARTMENT_OTHER): Payer: Self-pay | Admitting: Certified Registered Nurse Anesthetist

## 2019-05-26 ENCOUNTER — Ambulatory Visit (HOSPITAL_BASED_OUTPATIENT_CLINIC_OR_DEPARTMENT_OTHER): Payer: BC Managed Care – PPO | Admitting: Certified Registered Nurse Anesthetist

## 2019-05-26 ENCOUNTER — Encounter (HOSPITAL_BASED_OUTPATIENT_CLINIC_OR_DEPARTMENT_OTHER)
Admission: RE | Disposition: A | Discharge status: Home / Self Care | Payer: Self-pay | Source: Home / Self Care | Attending: Urology

## 2019-05-26 ENCOUNTER — Other Ambulatory Visit: Payer: Self-pay

## 2019-05-26 DIAGNOSIS — N433 Hydrocele, unspecified: Secondary | ICD-10-CM | POA: Insufficient documentation

## 2019-05-26 DIAGNOSIS — Z79899 Other long term (current) drug therapy: Secondary | ICD-10-CM | POA: Diagnosis not present

## 2019-05-26 DIAGNOSIS — Z8042 Family history of malignant neoplasm of prostate: Secondary | ICD-10-CM | POA: Diagnosis not present

## 2019-05-26 DIAGNOSIS — K219 Gastro-esophageal reflux disease without esophagitis: Secondary | ICD-10-CM | POA: Insufficient documentation

## 2019-05-26 DIAGNOSIS — F419 Anxiety disorder, unspecified: Secondary | ICD-10-CM | POA: Diagnosis not present

## 2019-05-26 DIAGNOSIS — J45909 Unspecified asthma, uncomplicated: Secondary | ICD-10-CM | POA: Diagnosis not present

## 2019-05-26 DIAGNOSIS — Z885 Allergy status to narcotic agent status: Secondary | ICD-10-CM | POA: Diagnosis not present

## 2019-05-26 DIAGNOSIS — G4730 Sleep apnea, unspecified: Secondary | ICD-10-CM | POA: Insufficient documentation

## 2019-05-26 HISTORY — DX: Presence of spectacles and contact lenses: Z97.3

## 2019-05-26 HISTORY — DX: Unspecified asthma, uncomplicated: J45.909

## 2019-05-26 HISTORY — DX: Migraine, unspecified, not intractable, without status migrainosus: G43.909

## 2019-05-26 HISTORY — DX: Hydrocele, unspecified: N43.3

## 2019-05-26 HISTORY — DX: Gastro-esophageal reflux disease without esophagitis: K21.9

## 2019-05-26 HISTORY — DX: Personal history of other diseases of the digestive system: Z87.19

## 2019-05-26 HISTORY — DX: Personal history of urinary calculi: Z87.442

## 2019-05-26 HISTORY — DX: Generalized anxiety disorder: F41.1

## 2019-05-26 HISTORY — PX: HYDROCELE EXCISION: SHX482

## 2019-05-26 LAB — NOVEL CORONAVIRUS, NAA (HOSP ORDER, SEND-OUT TO REF LAB; TAT 18-24 HRS): SARS-CoV-2, NAA: NOT DETECTED

## 2019-05-26 SURGERY — HYDROCELECTOMY
Anesthesia: General | Laterality: Right

## 2019-05-26 MED ORDER — ACETAMINOPHEN 325 MG PO TABS
650.0000 mg | ORAL_TABLET | ORAL | Status: DC | PRN
  Filled 2019-05-26: qty 2

## 2019-05-26 MED ORDER — SODIUM CHLORIDE 0.9% FLUSH
3.0000 mL | INTRAVENOUS | Status: DC | PRN
  Filled 2019-05-26: qty 3

## 2019-05-26 MED ORDER — BUPIVACAINE HCL (PF) 0.25 % IJ SOLN
INTRAMUSCULAR | Status: DC | PRN
  Administered 2019-05-26: 3 mL

## 2019-05-26 MED ORDER — PROPOFOL 10 MG/ML IV BOLUS
INTRAVENOUS | Status: DC | PRN
  Administered 2019-05-26: 150 mg via INTRAVENOUS

## 2019-05-26 MED ORDER — ONDANSETRON HCL 4 MG/2ML IJ SOLN
4.0000 mg | Freq: Once | INTRAMUSCULAR | Status: AC | PRN
  Administered 2019-05-26: 4 mg via INTRAVENOUS
  Filled 2019-05-26: qty 2

## 2019-05-26 MED ORDER — LIDOCAINE 2% (20 MG/ML) 5 ML SYRINGE
INTRAMUSCULAR | Status: DC | PRN
  Administered 2019-05-26: 100 mg via INTRAVENOUS

## 2019-05-26 MED ORDER — OXYCODONE HCL 5 MG PO TABS
5.0000 mg | ORAL_TABLET | ORAL | Status: DC | PRN
  Filled 2019-05-26: qty 2

## 2019-05-26 MED ORDER — MIDAZOLAM HCL 2 MG/2ML IJ SOLN
INTRAMUSCULAR | Status: DC | PRN
  Administered 2019-05-26: 2 mg via INTRAVENOUS

## 2019-05-26 MED ORDER — HYDROCODONE-ACETAMINOPHEN 5-325 MG PO TABS
1.0000 | ORAL_TABLET | Freq: Once | ORAL | Status: AC
  Administered 2019-05-26: 1 via ORAL
  Filled 2019-05-26: qty 1

## 2019-05-26 MED ORDER — CEFAZOLIN SODIUM-DEXTROSE 2-4 GM/100ML-% IV SOLN
INTRAVENOUS | Status: AC
  Filled 2019-05-26: qty 100

## 2019-05-26 MED ORDER — LACTATED RINGERS IV SOLN
INTRAVENOUS | Status: DC
  Administered 2019-05-26: 50 mL/h via INTRAVENOUS
  Administered 2019-05-26: 10:00:00 via INTRAVENOUS
  Filled 2019-05-26: qty 1000

## 2019-05-26 MED ORDER — HYDROCODONE-ACETAMINOPHEN 5-325 MG PO TABS
ORAL_TABLET | ORAL | Status: AC
  Filled 2019-05-26: qty 1

## 2019-05-26 MED ORDER — FENTANYL CITRATE (PF) 100 MCG/2ML IJ SOLN
INTRAMUSCULAR | Status: AC
  Filled 2019-05-26: qty 2

## 2019-05-26 MED ORDER — ONDANSETRON HCL 4 MG/2ML IJ SOLN
INTRAMUSCULAR | Status: AC
  Filled 2019-05-26: qty 2

## 2019-05-26 MED ORDER — MEPERIDINE HCL 25 MG/ML IJ SOLN
6.2500 mg | INTRAMUSCULAR | Status: DC | PRN
  Filled 2019-05-26: qty 1

## 2019-05-26 MED ORDER — SODIUM CHLORIDE 0.9 % IV SOLN
250.0000 mL | INTRAVENOUS | Status: DC | PRN
  Filled 2019-05-26: qty 250

## 2019-05-26 MED ORDER — HYDROMORPHONE HCL 1 MG/ML IJ SOLN
0.2500 mg | INTRAMUSCULAR | Status: DC | PRN
  Administered 2019-05-26: 12:00:00 0.5 mg via INTRAVENOUS
  Administered 2019-05-26: 0.25 mg via INTRAVENOUS
  Filled 2019-05-26: qty 0.5

## 2019-05-26 MED ORDER — HYDROCODONE-ACETAMINOPHEN 5-325 MG PO TABS: 1.0000 | ORAL_TABLET | Freq: Four times a day (QID) | ORAL | 0 refills | Status: DC | PRN

## 2019-05-26 MED ORDER — DEXAMETHASONE SODIUM PHOSPHATE 10 MG/ML IJ SOLN
INTRAMUSCULAR | Status: DC | PRN
  Administered 2019-05-26: 10 mg via INTRAVENOUS

## 2019-05-26 MED ORDER — LIDOCAINE 2% (20 MG/ML) 5 ML SYRINGE
INTRAMUSCULAR | Status: AC
  Filled 2019-05-26: qty 5

## 2019-05-26 MED ORDER — SODIUM CHLORIDE 0.9% FLUSH
3.0000 mL | Freq: Two times a day (BID) | INTRAVENOUS | Status: DC
  Filled 2019-05-26: qty 3

## 2019-05-26 MED ORDER — MORPHINE SULFATE (PF) 2 MG/ML IV SOLN
2.0000 mg | INTRAVENOUS | Status: DC | PRN
  Filled 2019-05-26: qty 1

## 2019-05-26 MED ORDER — FENTANYL CITRATE (PF) 100 MCG/2ML IJ SOLN
INTRAMUSCULAR | Status: DC | PRN
  Administered 2019-05-26: 100 ug via INTRAVENOUS

## 2019-05-26 MED ORDER — LIDOCAINE HCL (PF) 1 % IJ SOLN
INTRAMUSCULAR | Status: DC | PRN
  Administered 2019-05-26: 3 mL

## 2019-05-26 MED ORDER — ACETAMINOPHEN 650 MG RE SUPP
650.0000 mg | RECTAL | Status: DC | PRN
  Filled 2019-05-26: qty 1

## 2019-05-26 MED ORDER — HYDROMORPHONE HCL 1 MG/ML IJ SOLN
INTRAMUSCULAR | Status: AC
  Filled 2019-05-26: qty 1

## 2019-05-26 MED ORDER — ONDANSETRON HCL 4 MG/2ML IJ SOLN
INTRAMUSCULAR | Status: DC | PRN
  Administered 2019-05-26: 4 mg via INTRAVENOUS

## 2019-05-26 MED ORDER — MIDAZOLAM HCL 2 MG/2ML IJ SOLN
INTRAMUSCULAR | Status: AC
  Filled 2019-05-26: qty 2

## 2019-05-26 MED ORDER — SUCCINYLCHOLINE CHLORIDE 200 MG/10ML IV SOSY
PREFILLED_SYRINGE | INTRAVENOUS | Status: DC | PRN
  Administered 2019-05-26: 140 mg via INTRAVENOUS

## 2019-05-26 MED ORDER — CEFAZOLIN SODIUM-DEXTROSE 2-4 GM/100ML-% IV SOLN
2.0000 g | INTRAVENOUS | Status: AC
  Administered 2019-05-26: 2 g via INTRAVENOUS
  Filled 2019-05-26: qty 100

## 2019-05-26 MED ORDER — DEXAMETHASONE SODIUM PHOSPHATE 10 MG/ML IJ SOLN
INTRAMUSCULAR | Status: AC
  Filled 2019-05-26: qty 1

## 2019-05-26 SURGICAL SUPPLY — 38 items
BLADE CLIPPER SENSICLIP SURGIC (BLADE) ×3 IMPLANT
BLADE SURG 15 STRL LF DISP TIS (BLADE) ×1 IMPLANT
BLADE SURG 15 STRL SS (BLADE) ×3
BNDG GAUZE ELAST 4 BULKY (GAUZE/BANDAGES/DRESSINGS) ×3 IMPLANT
CANISTER SUCT 3000ML PPV (MISCELLANEOUS) IMPLANT
CANISTER SUCTION 1200CC (MISCELLANEOUS) IMPLANT
CLEANER CAUTERY TIP 5X5 PAD (MISCELLANEOUS) IMPLANT
COVER BACK TABLE 60X90IN (DRAPES) ×3 IMPLANT
COVER MAYO STAND STRL (DRAPES) ×3 IMPLANT
COVER WAND RF STERILE (DRAPES) ×6 IMPLANT
DISSECTOR ROUND CHERRY 3/8 STR (MISCELLANEOUS) IMPLANT
DRAIN PENROSE 18X1/4 LTX STRL (WOUND CARE) IMPLANT
DRAPE LAPAROTOMY 100X72 PEDS (DRAPES) ×3 IMPLANT
ELECT REM PT RETURN 9FT ADLT (ELECTROSURGICAL)
ELECTRODE REM PT RTRN 9FT ADLT (ELECTROSURGICAL) IMPLANT
GLOVE SURG SS PI 8.0 STRL IVOR (GLOVE) ×3 IMPLANT
GOWN STRL REUS W/ TWL LRG LVL3 (GOWN DISPOSABLE) ×1 IMPLANT
GOWN STRL REUS W/ TWL XL LVL3 (GOWN DISPOSABLE) ×1 IMPLANT
GOWN STRL REUS W/TWL LRG LVL3 (GOWN DISPOSABLE) ×3
GOWN STRL REUS W/TWL XL LVL3 (GOWN DISPOSABLE) ×3
KIT TURNOVER CYSTO (KITS) ×3 IMPLANT
MANIFOLD NEPTUNE II (INSTRUMENTS) IMPLANT
NDL HYPO 25X1 1.5 SAFETY (NEEDLE) ×1 IMPLANT
NEEDLE HYPO 25X1 1.5 SAFETY (NEEDLE) ×3 IMPLANT
NS IRRIG 500ML POUR BTL (IV SOLUTION) ×3 IMPLANT
PACK BASIN DAY SURGERY FS (CUSTOM PROCEDURE TRAY) ×3 IMPLANT
PAD CLEANER CAUTERY TIP 5X5 (MISCELLANEOUS)
PENCIL BUTTON HOLSTER BLD 10FT (ELECTRODE) ×3 IMPLANT
SUPPORT SCROTAL LG STRP (MISCELLANEOUS) ×2 IMPLANT
SUPPORTER ATHLETIC LG (MISCELLANEOUS) ×1
SUT CHROMIC 3 0 SH 27 (SUTURE) ×6 IMPLANT
SYR CONTROL 10ML LL (SYRINGE) ×3 IMPLANT
TOWEL OR 17X26 10 PK STRL BLUE (TOWEL DISPOSABLE) ×6 IMPLANT
TRAY DSU PREP LF (CUSTOM PROCEDURE TRAY) ×3 IMPLANT
TUBE CONNECTING 12'X1/4 (SUCTIONS) ×1
TUBE CONNECTING 12X1/4 (SUCTIONS) ×2 IMPLANT
WATER STERILE IRR 500ML POUR (IV SOLUTION) ×3 IMPLANT
YANKAUER SUCT BULB TIP NO VENT (SUCTIONS) ×3 IMPLANT

## 2019-05-26 NOTE — Anesthesia Procedure Notes (Signed)
Procedure Name: Intubation Date/Time: 05/26/2019 9:59 AM Performed by: Pearson Grippe, CRNA Pre-anesthesia Checklist: Patient identified, Emergency Drugs available, Suction available and Patient being monitored Patient Re-evaluated:Patient Re-evaluated prior to induction Oxygen Delivery Method: Circle system utilized Preoxygenation: Pre-oxygenation with 100% oxygen Induction Type: IV induction Laryngoscope Size: Miller and 2 Grade View: Grade I Tube type: Oral Tube size: 7.5 mm Number of attempts: 1 Airway Equipment and Method: Stylet Placement Confirmation: ETT inserted through vocal cords under direct vision,  positive ETCO2 and breath sounds checked- equal and bilateral Secured at: 21 cm Tube secured with: Tape Dental Injury: Teeth and Oropharynx as per pre-operative assessment

## 2019-05-26 NOTE — Discharge Instructions (Addendum)
HOME CARE INSTRUCTIONS FOR SCROTAL PROCEDURES  Wound Care & Hygiene: You may apply an ice bag to the scrotum for the first 24 hours.  This may help decrease swelling and soreness.  You may have a dressing held in place by an athletic supporter.  You may remove the dressing in 24 hours and shower in 48 hours.  Continue to use the athletic supporter or tight briefs for at least a week.  Please remove the drain in the morning.  It should just slide out but if it doesn't, please call the office to set up a time to come have it removed.    Activity: Rest today - not necessarily flat bed rest.  Just take it easy.  You should not do strenuous activities until your follow-up visit with your doctor.  You may resume light activity in 48 hours.  Return to Work:  Your doctor will advise you of this depending on the type of work you do  Diet: Drink liquids or eat a light diet this evening.  You may resume a regular diet tomorrow.  General Expectations: You may have a small amount of bleeding.  The scrotum may be swollen or bruised for about a week.  Call your Doctor if these occur:  -persistent or heavy bleeding  -temperature of 101 degrees or more  -severe pain, not relieved by your pain medication  Return to [employer]:  Call to set up and appointment.   Post Anesthesia Home Care Instructions  Activity: Get plenty of rest for the remainder of the day. A responsible adult should stay with you for 24 hours following the procedure.  For the next 24 hours, DO NOT: -Drive a car -Advertising copywriter -Drink alcoholic beverages -Take any medication unless instructed by your physician -Make any legal decisions or sign important papers.  Meals: Start with liquid foods such as gelatin or soup. Progress to regular foods as tolerated. Avoid greasy, spicy, heavy foods. If nausea and/or vomiting occur, drink only clear liquids until the nausea and/or vomiting subsides. Call your physician if  vomiting continues.  Special Instructions/Symptoms: Your throat may feel dry or sore from the anesthesia or the breathing tube placed in your throat during surgery. If this causes discomfort, gargle with warm salt water. The discomfort should disappear within 24 hours.  If you had a scopolamine patch placed behind your ear for the management of post- operative nausea and/or vomiting:  1. The medication in the patch is effective for 72 hours, after which it should be removed.  Wrap patch in a tissue and discard in the trash. Wash hands thoroughly with soap and water. 2. You may remove the patch earlier than 72 hours if you experience unpleasant side effects which may include dry mouth, dizziness or visual disturbances. 3. Avoid touching the patch. Wash your hands with soap and water after contact with the patch.

## 2019-05-26 NOTE — Anesthesia Postprocedure Evaluation (Signed)
Anesthesia Post Note  Patient: Jeffrey Henderson  Procedure(s) Performed: HYDROCELECTOMY ADULT (Right )     Patient location during evaluation: PACU Anesthesia Type: General Level of consciousness: awake and alert Pain management: pain level controlled Vital Signs Assessment: post-procedure vital signs reviewed and stable Respiratory status: spontaneous breathing, nonlabored ventilation, respiratory function stable and patient connected to nasal cannula oxygen Cardiovascular status: blood pressure returned to baseline and stable Postop Assessment: no apparent nausea or vomiting Anesthetic complications: no    Last Vitals:  Vitals:   05/26/19 1245 05/26/19 1305  BP:  127/71  Pulse: 85 63  Resp: 13 20  Temp:  36.8 C  SpO2: 100% 100%    Last Pain:  Vitals:   05/26/19 1245  TempSrc:   PainSc: 3                  [name]

## 2019-05-26 NOTE — Transfer of Care (Signed)
Immediate Anesthesia Transfer of Care Note  Patient: ESIAH COFFELL  Procedure(s) Performed: HYDROCELECTOMY ADULT (Right )  Patient Location: PACU  Anesthesia Type:General  Level of Consciousness: awake, alert , oriented and patient cooperative  Airway & Oxygen Therapy: Patient Spontanous Breathing and Patient connected to face mask oxygen  Post-op Assessment: Report given to RN and Post -op Vital signs reviewed and stable  Post vital signs: Reviewed and stable  Last Vitals:  Vitals Value Taken Time  BP    Temp    Pulse 69 05/26/2019 10:43 AM  Resp    SpO2 95 % 05/26/2019 10:43 AM  Vitals shown include unvalidated device data.  Last Pain:  Vitals:   05/26/19 0730  TempSrc:   PainSc: 0-No pain      Patients Stated Pain Goal: 5 (05/26/19 0730)  Complications: No apparent anesthesia complications

## 2019-05-26 NOTE — Op Note (Signed)
Procedure: Right hydrocelectomy.  Preop diagnosis: Right hydrocele.  Postop diagnosis: Same.  Surgeon: Dr. Bjorn Pippin.  Anesthesia: General.  Specimen: None.  Drains: Quarter-inch Penrose drain.  EBL: Minimal.  Complications: None.  Indications: Jeffrey Henderson is a 53 year old white male with a symptomatic right hydrocele.  He had previously undergone aspiration of the hydrocele while elective surgery was on hold for the COVID outbreak but he rapidly reaccumulated and is elected to undergo surgical repair.  Procedure: He was given 2 g of Ancef.  He was taken operating room where general anesthetic was induced.  He was placed in the supine position and fitted with PAS hose.  His scrotum was clipped,  he was prepped with Betadine solution and draped in the usual sterile fashion.  A right cord block was performed with approximately 4 mL of quarter percent Marcaine in a 50-50 mixture with 1% lidocaine.  A right anterior oblique scrotal incision was made with a knife and carried through the dartos with the Bovie.  The hydrocele was delivered into the wound.  The sac was opened and drained.  There was approximately 50 cc of fluid and the hydrocele.  The sac was somewhat thickened.  The sac was imbricated behind the testicle with a running 3-0 chromic suture and secured in a water bottle fashion.  There was not enough redundant sac to require excision.  Hemostasis was achieved.  1/4 inch Penrose drain was placed through a stab wound in the right inferior scrotum and temporarily secured with a towel clip.  The dartos was closed using a running 3-0 chromic suture with great care being taken to avoid entrapment of the drain.  Additional local anesthetic was infiltrated into the skin edges for a total of approximately 7 to 8 cc.  The skin was then closed using a running 3-0 chromic vertical mattress suture.  The wound was cleansed.  The drain was trimmed to an appropriate length and the towel clip was removed.  A  dressing of 4 x 4's followed by fluff Kerlix and scrotal support was applied.  His anesthetic was reversed and he was moved to recovery in stable condition.  There were no complications.

## 2019-05-26 NOTE — Interval H&P Note (Signed)
History and Physical Interval Note:  05/26/2019 9:32 AM  Jeffrey Henderson  has presented today for surgery, with the diagnosis of RIGHT HYDROCELE.  The various methods of treatment have been discussed with the patient and family. After consideration of risks, benefits and other options for treatment, the patient has consented to  Procedure(s): HYDROCELECTOMY ADULT (Right) as a surgical intervention.  The patient's history has been reviewed, patient examined, no change in status, stable for surgery.  I have reviewed the patient's chart and labs.  Questions were answered to the patient's satisfaction.     Bjorn Pippin

## 2019-05-26 NOTE — Anesthesia Preprocedure Evaluation (Signed)
Anesthesia Evaluation  Patient identified by MRN, date of birth, ID band Patient awake    Reviewed: Allergy & Precautions, NPO status , Patient's Chart, lab work & pertinent test results  Airway Mallampati: I  TM Distance: >3 FB Neck ROM: Full    Dental   Pulmonary    Pulmonary exam normal        Cardiovascular Normal cardiovascular exam     Neuro/Psych Anxiety    GI/Hepatic GERD  Medicated and Controlled,  Endo/Other    Renal/GU      Musculoskeletal   Abdominal   Peds  Hematology   Anesthesia Other Findings   Reproductive/Obstetrics                             Anesthesia Physical Anesthesia Plan  ASA: III  Anesthesia Plan: General   Post-op Pain Management:    Induction: Intravenous  PONV Risk Score and Plan: 2 and Ondansetron and Midazolam  Airway Management Planned: Oral ETT  Additional Equipment:   Intra-op Plan:   Post-operative Plan: Extubation in OR  Informed Consent: I have reviewed the patients History and Physical, chart, labs and discussed the procedure including the risks, benefits and alternatives for the proposed anesthesia with the patient or authorized representative who has indicated his/her understanding and acceptance.       Plan Discussed with: CRNA and Surgeon  Anesthesia Plan Comments:         Anesthesia Quick Evaluation

## 2019-05-27 ENCOUNTER — Encounter (HOSPITAL_BASED_OUTPATIENT_CLINIC_OR_DEPARTMENT_OTHER): Payer: Self-pay | Admitting: Urology

## 2020-01-30 IMAGING — CR DG ABDOMEN 1V
1 series · 1 of 1 positions shown · non-contrast
Comparison: No priors.

CLINICAL DATA: 52-year-old male with history of constipation for
the past 3-4 days. Increasing abdominal pain and nausea.

EXAM:
ABDOMEN - 1 VIEW

[t abdomen supine]
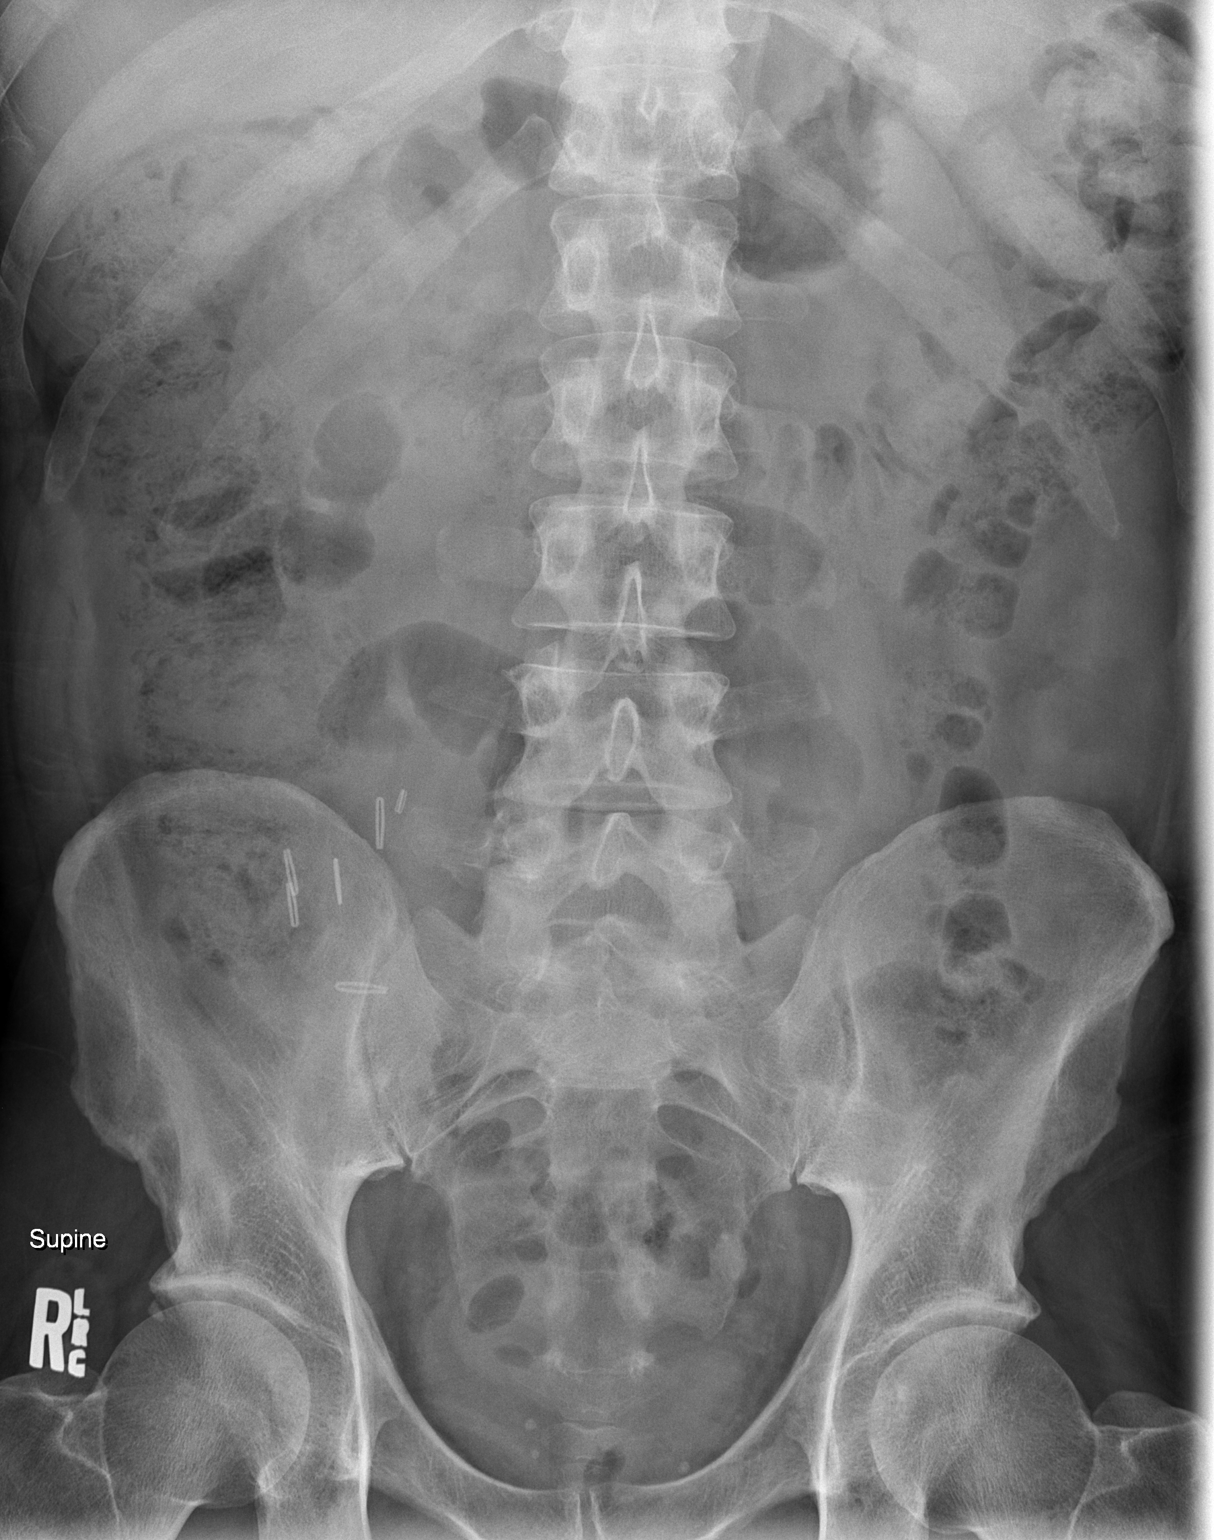

[1 of 1 positions shown; findings below may reference images not displayed]

FINDINGS: Gas and stool are seen scattered throughout the colon extending to
the level of the distal rectum. No pathologic distension of small
bowel is noted. No gross evidence of pneumoperitoneum. Surgical
clips in the right lower quadrant likely from prior appendectomy.
IMPRESSION: 1. Nonobstructive bowel gas pattern.
2. No pneumoperitoneum.

## 2021-09-20 ENCOUNTER — Emergency Department (HOSPITAL_BASED_OUTPATIENT_CLINIC_OR_DEPARTMENT_OTHER)
Admission: EM | Admit: 2021-09-20 | Discharge: 2021-09-20 | Disposition: A | Discharge status: Home / Self Care | Payer: BLUE CROSS/BLUE SHIELD | Attending: Emergency Medicine | Admitting: Emergency Medicine

## 2021-09-20 ENCOUNTER — Other Ambulatory Visit: Payer: Self-pay

## 2021-09-20 ENCOUNTER — Emergency Department (HOSPITAL_BASED_OUTPATIENT_CLINIC_OR_DEPARTMENT_OTHER): Payer: BLUE CROSS/BLUE SHIELD

## 2021-09-20 ENCOUNTER — Encounter (HOSPITAL_BASED_OUTPATIENT_CLINIC_OR_DEPARTMENT_OTHER): Payer: Self-pay

## 2021-09-20 DIAGNOSIS — R1032 Left lower quadrant pain: Secondary | ICD-10-CM | POA: Diagnosis present

## 2021-09-20 DIAGNOSIS — J45909 Unspecified asthma, uncomplicated: Secondary | ICD-10-CM | POA: Insufficient documentation

## 2021-09-20 DIAGNOSIS — K5790 Diverticulosis of intestine, part unspecified, without perforation or abscess without bleeding: Secondary | ICD-10-CM | POA: Diagnosis not present

## 2021-09-20 DIAGNOSIS — K219 Gastro-esophageal reflux disease without esophagitis: Secondary | ICD-10-CM | POA: Insufficient documentation

## 2021-09-20 DIAGNOSIS — Z87891 Personal history of nicotine dependence: Secondary | ICD-10-CM | POA: Insufficient documentation

## 2021-09-20 DIAGNOSIS — K7689 Other specified diseases of liver: Secondary | ICD-10-CM | POA: Diagnosis not present

## 2021-09-20 DIAGNOSIS — R1012 Left upper quadrant pain: Secondary | ICD-10-CM | POA: Diagnosis not present

## 2021-09-20 LAB — CBC
HCT: 43.3 % (ref 39.0–52.0)
Hemoglobin: 14.9 g/dL (ref 13.0–17.0)
MCH: 30.5 pg (ref 26.0–34.0)
MCHC: 34.4 g/dL (ref 30.0–36.0)
MCV: 88.5 fL (ref 80.0–100.0)
Platelets: 306 10*3/uL (ref 150–400)
RBC: 4.89 MIL/uL (ref 4.22–5.81)
RDW: 12.7 % (ref 11.5–15.5)
WBC: 6.6 10*3/uL (ref 4.0–10.5)
nRBC: 0 % (ref 0.0–0.2)

## 2021-09-20 LAB — COMPREHENSIVE METABOLIC PANEL
ALT: 27 U/L (ref 0–44)
AST: 30 U/L (ref 15–41)
Albumin: 4.8 g/dL (ref 3.5–5.0)
Alkaline Phosphatase: 44 U/L (ref 38–126)
Anion gap: 9 (ref 5–15)
BUN: 11 mg/dL (ref 6–20)
CO2: 27 mmol/L (ref 22–32)
Calcium: 9.6 mg/dL (ref 8.9–10.3)
Chloride: 102 mmol/L (ref 98–111)
Creatinine, Ser: 1.15 mg/dL (ref 0.61–1.24)
GFR, Estimated: >60 mL/min (ref >60)
Glucose, Bld: 98 mg/dL (ref 70–99)
Potassium: 4.2 mmol/L (ref 3.5–5.1)
Sodium: 138 mmol/L (ref 135–145)
Total Bilirubin: 0.8 mg/dL (ref 0.3–1.2)
Total Protein: 7.7 g/dL (ref 6.5–8.1)

## 2021-09-20 LAB — LIPASE, BLOOD: Lipase: 27 U/L (ref 11–51)

## 2021-09-20 LAB — URINALYSIS, ROUTINE W REFLEX MICROSCOPIC
Bilirubin Urine: NEGATIVE
Glucose, UA: NEGATIVE mg/dL
Hgb urine dipstick: NEGATIVE
Ketones, ur: NEGATIVE mg/dL
Leukocytes,Ua: NEGATIVE
Nitrite: NEGATIVE
Protein, ur: NEGATIVE mg/dL
Specific Gravity, Urine: <1.005 — ABNORMAL LOW (ref 1.005–1.030)
pH: 6.5 (ref 5.0–8.0)

## 2021-09-20 MED ORDER — SODIUM CHLORIDE 0.9 % IV BOLUS
1000.0000 mL | Freq: Once | INTRAVENOUS | Status: AC
  Administered 2021-09-20: 1000 mL via INTRAVENOUS

## 2021-09-20 MED ORDER — IOHEXOL 350 MG/ML SOLN
80.0000 mL | Freq: Once | INTRAVENOUS | Status: AC | PRN
  Administered 2021-09-20: 80 mL via INTRAVENOUS

## 2021-09-20 NOTE — ED Triage Notes (Signed)
Patient reports had colonscopy on Monday and had pain afterwards but pain has continued to get worse in LLQ. Denies blood in stool

## 2021-09-20 NOTE — Discharge Instructions (Signed)
Your CT scan showed diverticulosis but no obvious perforation or diverticulitis.  You have incidental cyst on the your liver which is on the right side and does not believe you are hurting.  You can get an ultrasound with your doctor for follow-up  Please follow-up with your GI doctor.  You can eat a normal diet now  Return to ER if you have worse abdominal pain, vomiting, not passing gas, blood in your stool

## 2021-09-20 NOTE — ED Provider Notes (Signed)
[facility] EMERGENCY DEPT Provider Note   CSN: [id number] Arrival date & time: 09/20/21  1652     History Chief Complaint  Patient presents with   Abdominal Pain    Jeffrey Henderson is a 55 y.o. male history of diverticulosis, previous appendicitis status post appendectomy, here presenting with left lower quadrant pain.  Patient had a colonoscopy done 5 days ago by [facility].  He states that he started having left-sided abdominal pain since then.  He states that it was crampy and was intermittent but became more sharp yesterday.  He took some laxatives and states that he had a large bowel movement yesterday.  He states that today the pain is persistent despite a bowel movement.  He called GI office and sent in for further evaluation.  Denies any vomiting.  The history is provided by the patient.      Past Medical History:  Diagnosis Date   Asthma    Childhood   Diverticulosis of sigmoid colon 03/21/2016   Eosinophilic esophagitis 03/21/2010   [facility].  Treated with oral fluticasone therapy      GAD (generalized anxiety disorder)    GERD (gastroesophageal reflux disease)    Hiatal hernia 03/21/2016   History of appendicitis    History of constipation    History of inguinal hernia    Left?   History of kidney stones    Hydrocele    Moderate right and small left-sided hydrocele   Liver cyst 06/2017   Bilateral, 2.4 cm cyst right lobe of the liver , noted on CT Abd   Migraines    occ   Schatzki's ring of distal esophagus 03/21/2016   Wears glasses    reading    Patient Active Problem List   Diagnosis Date Noted   Hiatal hernia 03/21/2016   Schatzki's ring of distal esophagus 03/21/2016   Diverticulosis of sigmoid colon 03/21/2016   Low back pain 02/07/2015   Neck pain 02/07/2015   Eosinophilic esophagitis 03/21/2010    Past Surgical History:  Procedure Laterality Date   APPENDECTOMY     ESOPHAGEAL DILATION     ESOPHAGOGASTRODUODENOSCOPY  2011   HYDROCELE  EXCISION Right 05/26/2019   Procedure: HYDROCELECTOMY ADULT;  Surgeon: Bjorn Pippin, MD;  Location: [facility] [place];  Service: Urology;  Laterality: Right;   INGUINAL HERNIA REPAIR Left    age 32   WISDOM TOOTH EXTRACTION         No family history on file.  Social History   Tobacco Use   Smoking status: Never   Smokeless tobacco: Former  Building services engineer Use: Never used  Substance Use Topics   Alcohol use: Yes   Drug use: No    Home Medications Prior to Admission medications   Medication Sig Start Date End Date Taking? Authorizing Provider  HYDROcodone-acetaminophen (NORCO) 5-325 MG tablet Take 1 tablet by mouth every 6 (six) hours as needed for moderate pain. 05/26/19   Bjorn Pippin, MD  LORazepam (ATIVAN) 1 MG tablet TAKE ONE TABLET (1 MG DOSE) BY MOUTH DAILY AS NEEDED FOR ANXIETY. 06/10/18   [provider]  omeprazole (PRILOSEC) 20 MG capsule Take 20 mg by mouth every morning.     [provider]  Probiotic Product (PROBIOTIC PO) Take 1 capsule by mouth daily.    [provider]  traZODone (DESYREL) 100 MG tablet TAKE 1 TABLET BY MOUTH EVERYDAY AT BEDTIME 04/09/18   [provider]  vitamin B-12 (CYANOCOBALAMIN) 1000 MCG  tablet Take 1,000 mcg by mouth daily.    [provider]  vitamin C (ASCORBIC ACID) 500 MG tablet Take 500 mg by mouth daily.    [provider]    Allergies    Codeine  Review of Systems   Review of Systems  Gastrointestinal:  Positive for abdominal pain.  All other systems reviewed and are negative.  Physical Exam Updated Vital Signs BP (!) 159/100   Pulse 78   Temp 98.2 F (36.8 C) (Oral)   Resp 16   Ht 5\' 11"  (1.803 m)   Wt 88.5 kg   SpO2 100%   BMI 27.20 kg/m   Physical Exam Vitals and nursing note reviewed.  Constitutional:      Comments: Uncomfortable  HENT:     Head: Normocephalic.     Mouth/Throat:     Mouth: Mucous membranes are moist.  Eyes:     Extraocular  Movements: Extraocular movements intact.     Pupils: Pupils are equal, round, and reactive to light.  Cardiovascular:     Rate and Rhythm: Normal rate and regular rhythm.  Abdominal:     Comments: Distended and mild left upper quadrant and left lower quadrant tenderness  Skin:    General: Skin is warm.     Capillary Refill: Capillary refill takes less than 2 seconds.  Neurological:     General: No focal deficit present.     Mental Status: He is oriented to person, place, and time.  Psychiatric:        Mood and Affect: Mood normal.        Behavior: Behavior normal.    ED Results / Procedures / Treatments   Labs (all labs ordered are listed, but only abnormal results are displayed) Labs Reviewed  URINALYSIS, ROUTINE W REFLEX MICROSCOPIC - Abnormal; Notable for the following components:      Result Value   Specific Gravity, Urine <1.005 (*)    All other components within normal limits  LIPASE, BLOOD  COMPREHENSIVE METABOLIC PANEL  CBC    EKG None  Radiology CT ABDOMEN PELVIS W CONTRAST  Result Date: 09/20/2021 CLINICAL DATA:  Left lower quadrant pain status post colonoscopy. EXAM: CT ABDOMEN AND PELVIS WITH CONTRAST TECHNIQUE: Multidetector CT imaging of the abdomen and pelvis was performed using the standard protocol following bolus administration of intravenous contrast. CONTRAST:  32mL OMNIPAQUE IOHEXOL 350 MG/ML SOLN COMPARISON:  April 05, 2019 FINDINGS: Lower chest: No acute abnormality. Hepatobiliary: 5 mm foci of parenchymal low attenuation are seen within the right lobe of the liver (axial CT image 12, CT series 2). These are too small to characterize by CT examination. An additional 9 mm focus of low attenuation is seen within the posteromedial aspect of the right lobe. No gallstones, gallbladder wall thickening, or biliary dilatation. Pancreas: Unremarkable. No pancreatic ductal dilatation or surrounding inflammatory changes. Spleen: Normal in size without focal  abnormality. Adrenals/Urinary Tract: Adrenal glands are unremarkable. Kidneys are normal, without renal calculi, focal lesion, or hydronephrosis. Bladder is unremarkable. Stomach/Bowel: Stomach is within normal limits. Appendix appears normal. No evidence of bowel wall thickening, distention, or inflammatory changes. Noninflamed diverticula are seen throughout the sigmoid colon. Vascular/Lymphatic: No significant vascular findings are present. No enlarged abdominal or pelvic lymph nodes. Reproductive: The prostate gland is mildly enlarged. Other: No abdominal wall hernia or abnormality. No abdominopelvic ascites. Musculoskeletal: No acute or significant osseous findings. IMPRESSION: 1. Sigmoid diverticulosis. 2. Small hepatic cysts versus hemangiomas. Correlation with nonemergent hepatic ultrasound is recommended.  Electronically Signed   By: Aram Candela M.D.   On: 09/20/2021 19:25    Procedures Procedures   Medications Ordered in ED Medications  sodium chloride 0.9 % bolus 1,000 mL (1,000 mLs Intravenous New Bag/Given 09/20/21 1747)  iohexol (OMNIPAQUE) 350 MG/ML injection 80 mL (80 mLs Intravenous Contrast Given 09/20/21 1910)    ED Course  I have reviewed the triage vital signs and the nursing notes.  Pertinent labs & imaging results that were available during my care of the patient were reviewed by me and considered in my medical decision making (see chart for details).    MDM Rules/Calculators/A&P                           Jeffrey Henderson is a 55 y.o. male here presenting with left-sided abdominal pain and distention.  Given that he recently had colonoscopy, consider perforation or ileus.  Will get labs and CT abdomen pelvis.  Will hydrate and reassess  7:32 PM CT showed diverticulosis but no diverticulitis.  He has incidental small hepatic cysts that can be followed up outpatient.  Labs unremarkable.  At this Henderson, he is stable for discharge.  There is no perforation.  Final  Clinical Impression(s) / ED Diagnoses Final diagnoses:  None    Rx / DC Orders ED Discharge Orders     None        Charlynne Pander, MD 09/20/21 1932

## 2022-02-17 ENCOUNTER — Ambulatory Visit: Payer: BLUE CROSS/BLUE SHIELD | Admitting: Cardiology

## 2022-02-17 ENCOUNTER — Other Ambulatory Visit: Payer: Self-pay

## 2022-02-17 ENCOUNTER — Encounter: Payer: Self-pay | Admitting: Cardiology

## 2022-02-17 VITALS — BP 121/76 | HR 70 | Temp 98.0°F | Resp 16 | Ht 71.0 in | Wt 190.8 lb

## 2022-02-17 DIAGNOSIS — R072 Precordial pain: Secondary | ICD-10-CM

## 2022-02-17 DIAGNOSIS — R002 Palpitations: Secondary | ICD-10-CM

## 2022-02-17 DIAGNOSIS — K449 Diaphragmatic hernia without obstruction or gangrene: Secondary | ICD-10-CM

## 2022-02-17 DIAGNOSIS — K222 Esophageal obstruction: Secondary | ICD-10-CM

## 2022-02-17 NOTE — Progress Notes (Signed)
Date:  02/17/2022   ID:  Jeffrey Henderson, DOB 03-08-1966, MRN [id number]  PCP:  Bernerd Limbo, MD  Cardiologist:  Rex Kras, DO, [facility] (established care 02/17/22)  REASON FOR CONSULT: Palpitations  REQUESTING PHYSICIAN:  Sooker, Luiz Ochoa, NP [place],  [place] [id number]  Chief Complaint  Patient presents with   Palpitations   New Patient (Initial Visit)   Chest Pain    HPI  Jeffrey Henderson is a 56 y.o. Caucasian male who presents to the office with a chief complaint of " palpitations and chest pain." Patient's past medical history and cardiovascular risk factors include: History of eosinophilic esophagitis, Schatzki's ring, GERD.  He is referred to the office at the request of Sooker, Luiz Ochoa, NP for evaluation of palpitations.  Palpitations: Symptoms have been ongoing for the last 3 months, they occur randomly but are more noticeable at night, lasting for a few seconds at a time, no improving or worsening factors, self-limited.  No associated near-syncope or syncopal events.  At times she does experience shortness of breath.  When the symptoms occur he tries to deviate his attention to another task which helps alleviate the symptomatology.  He drinks 1 cup of coffee per day, on the weekends 3 to 4 cans of beer each day.  No longer consumes energy drinks. The consumption of soda, illicit drugs, herbal supplements, OTC, stimulants, weight loss supplements.  No known thyroid or anemia issues in the past.  In the last labs were at least 1 year ago with PCP as part of annual well visit.   Precordial pain: Also present for the last 3 months or so, located over the right precordial chest region, tightness like sensation, not brought on by effort related activities, does not resolve with rest, usually self-limited.  Patient goes to the gym 4 days a week and has not noticed any significant change in his overall physical endurance.  No family history of premature  coronary artery disease, sudden cardiac death, or syncope.  Other contributing factors may include his GI history which includes hiatal hernia, eosinophilic esophagitis, and Schatzki's ring requiring esophageal dilatation.  Last GI evaluation at least 10 years ago per patient.  FUNCTIONAL STATUS: Gym 4 days a week : resistance training and cardio (treadmill and biking).    ALLERGIES: Allergies  Allergen Reactions   Codeine Palpitations    MEDICATION LIST PRIOR TO VISIT: Current Meds  Medication Sig   LORazepam (ATIVAN) 1 MG tablet TAKE ONE TABLET (1 MG DOSE) BY MOUTH DAILY AS NEEDED FOR ANXIETY.   omeprazole (PRILOSEC) 20 MG capsule Take 20 mg by mouth every morning.    Probiotic Product (PROBIOTIC PO) Take 1 capsule by mouth daily.   traZODone (DESYREL) 100 MG tablet TAKE 1 TABLET BY MOUTH EVERYDAY AT BEDTIME   vitamin B-12 (CYANOCOBALAMIN) 1000 MCG tablet Take 1,000 mcg by mouth daily.     PAST MEDICAL HISTORY: Past Medical History:  Diagnosis Date   Asthma    Childhood   Diverticulosis of sigmoid colon 123XX123   Eosinophilic esophagitis [id number]   [facility].  Treated with oral fluticasone therapy      GAD (generalized anxiety disorder)    GERD (gastroesophageal reflux disease)    Hiatal hernia 03/21/2016   History of appendicitis    History of constipation    History of inguinal hernia    Left?   History of kidney stones    Hydrocele    Moderate right and small left-sided hydrocele  Liver cyst 06/2017   Bilateral, 2.4 cm cyst right lobe of the liver , noted on CT Abd   Migraines    occ   Schatzki's ring of distal esophagus 03/21/2016   Wears glasses    reading    PAST SURGICAL HISTORY: Past Surgical History:  Procedure Laterality Date   APPENDECTOMY     ESOPHAGEAL DILATION     ESOPHAGOGASTRODUODENOSCOPY  2011   HYDROCELE EXCISION Right 05/26/2019   Procedure: HYDROCELECTOMY ADULT;  Surgeon: Irine Seal, MD;  Location: [facility];  Service:  Urology;  Laterality: Right;   INGUINAL HERNIA REPAIR Left    age 41   WISDOM TOOTH EXTRACTION      FAMILY HISTORY: The patient family history includes Cancer in his sister; Healthy in his brother, father, mother, and sister.  SOCIAL HISTORY:  The patient  reports that he has never smoked. He has quit using smokeless tobacco. He reports current alcohol use of about 1.0 - 2.0 standard drink per week. He reports that he does not use drugs.  REVIEW OF SYSTEMS: Review of Systems  Cardiovascular:  Positive for chest pain and palpitations. Negative for dyspnea on exertion, leg swelling and syncope.  Respiratory:  Positive for shortness of breath. Negative for sleep disturbances due to breathing.        Difficult falling asleep.   PHYSICAL EXAM: Vitals with BMI 02/17/2022 09/20/2021 09/20/2021  Height 5\' 11"  - 5\' 11"   Weight 190 lbs 13 oz - 195 lbs  BMI 123456 - 123456  Systolic 123XX123 [id number] [id number]  Diastolic 76 123XX123 96  Pulse 70 78 76    CONSTITUTIONAL: Well-developed and well-nourished. No acute distress.  SKIN: Skin is warm and dry. No rash noted. No cyanosis. No pallor. No jaundice HEAD: Normocephalic and atraumatic.  EYES: No scleral icterus MOUTH/THROAT: Moist oral membranes.  NECK: No JVD present. No thyromegaly noted. No carotid bruits  LYMPHATIC: No visible cervical adenopathy.  CHEST Normal respiratory effort. No intercostal retractions  LUNGS: Clear to auscultation bilaterally.  No stridor. No wheezes. No rales.  CARDIOVASCULAR: Regular rate and rhythm, positive S1-S2, no murmurs rubs or gallops appreciated. ABDOMINAL: Obese, soft, nontender, nondistended, positive bowel sounds all 4 quadrants. No apparent ascites.  EXTREMITIES: No peripheral edema, warm to touch, +2 bilateral DP and PT pulses HEMATOLOGIC: No significant bruising NEUROLOGIC: Oriented to person, place, and time. Nonfocal. Normal muscle tone.  PSYCHIATRIC: Normal mood and affect. Normal behavior. Cooperative  CARDIAC  DATABASE: EKG: 02/05/2022 (provided by PCP): Normal sinus rhythm, 61 bpm. 02/17/2022: NSR, 61 bpm, normal axis, PR WP, nonspecific ST-T changes.   Echocardiogram: No results found for this or any previous visit from the past 1095 days.    Stress Testing: No results found for this or any previous visit from the past 1095 days.   Heart Catheterization: None  LABORATORY DATA: CBC Latest Ref Rng & Units 09/20/2021  WBC 4.0 - 10.5 K/uL 6.6  Hemoglobin 13.0 - 17.0 g/dL 14.9  Hematocrit 39.0 - 52.0 % 43.3  Platelets 150 - 400 K/uL 306    CMP Latest Ref Rng & Units 09/20/2021  Glucose 70 - 99 mg/dL 98  BUN 6 - 20 mg/dL 11  Creatinine 0.61 - 1.24 mg/dL 1.15  Sodium 135 - 145 mmol/L 138  Potassium 3.5 - 5.1 mmol/L 4.2  Chloride 98 - 111 mmol/L 102  CO2 22 - 32 mmol/L 27  Calcium 8.9 - 10.3 mg/dL 9.6  Total Protein 6.5 - 8.1 g/dL 7.7  Total  Bilirubin 0.3 - 1.2 mg/dL 0.8  Alkaline Phos 38 - 126 U/L 44  AST 15 - 41 U/L 30  ALT 0 - 44 U/L 27    Lipid Panel  No results found for: CHOL, TRIG, HDL, CHOLHDL, VLDL, LDLCALC, LDLDIRECT, LABVLDL  No components found for: NTPROBNP No results for input(s): PROBNP in the last 8760 hours. No results for input(s): TSH in the last 8760 hours.  BMP Recent Labs    09/20/21 1706  NA 138  K 4.2  CL 102  CO2 27  GLUCOSE 98  BUN 11  CREATININE 1.15  CALCIUM 9.6  GFRNONAA >60    HEMOGLOBIN A1C No results found for: HGBA1C, MPG  IMPRESSION:    ICD-10-CM   1. Palpitations  R00.2 EKG 12-Lead    2. Precordial pain  R07.2     3. Hiatal hernia  K44.9     4. Schatzki's ring of distal esophagus  K22.2        RECOMMENDATIONS: OLLIN RITTS is a 56 y.o. Caucasian male whose past medical history and cardiac risk factors include: History of eosinophilic esophagitis, Schatzki's ring, GERD.  Palpitations No identifiable reversible cause. Check CBC and BMP to evaluate for hemoglobin electrolyte abnormalities. Check TSH to evaluate for  thyroid disease. 7-day extended Holter monitor to evaluate for dysrhythmias. We will defer work-up of anxiety to primary care for now. Patient is also asked to reestablish care with GI for reevaluation given his history of eosinophilic esophagitis, Schatzki's ring, and GERD.  He has been on current dose of omeprazole for at least 10 years without any uptitration or other medications.  Precordial pain Precordial discomfort appears to be noncardiac.  Patient has been reassured; however, we will proceed with an echocardiogram and exercise treadmill stress test for further risk stratification. Patient is encouraged to have screening lipid profile and A1c checked as part of his annual well visit coming up.  Hiatal hernia / Schatzki's ring of distal esophagus Recommendations as noted above. Currently on omeprazole.  As part of today's office visit discussed management of at least 2 chronic comorbid conditions, reviewed external notes provided by referring provider including outside EKG.  Ordered and interpreted today's EKG as noted above.  Also ordered additional diagnostic testing as discussed above.  Further recommendations to follow.  FINAL MEDICATION LIST END OF ENCOUNTER: No orders of the defined types were placed in this encounter.   Medications Discontinued During This Encounter  Medication Reason   HYDROcodone-acetaminophen (NORCO) 5-325 MG tablet    vitamin C (ASCORBIC ACID) 500 MG tablet      Current Outpatient Medications:    LORazepam (ATIVAN) 1 MG tablet, TAKE ONE TABLET (1 MG DOSE) BY MOUTH DAILY AS NEEDED FOR ANXIETY., Disp: , Rfl: 1   omeprazole (PRILOSEC) 20 MG capsule, Take 20 mg by mouth every morning. , Disp: , Rfl:    Probiotic Product (PROBIOTIC PO), Take 1 capsule by mouth daily., Disp: , Rfl:    traZODone (DESYREL) 100 MG tablet, TAKE 1 TABLET BY MOUTH EVERYDAY AT BEDTIME, Disp: , Rfl: 1   vitamin B-12 (CYANOCOBALAMIN) 1000 MCG tablet, Take 1,000 mcg by mouth daily.,  Disp: , Rfl:   Orders Placed This Encounter  Procedures   EKG 12-Lead    There are no Patient Instructions on file for this visit.   --Continue cardiac medications as reconciled in final medication list. --No follow-ups on file. Or sooner if needed. --Continue follow-up with your primary care physician regarding the management of your  other chronic comorbid conditions.  Patient's questions and concerns were addressed to his satisfaction. He voices understanding of the instructions provided during this encounter.   This note was created using a voice recognition software as a result there may be grammatical errors inadvertently enclosed that do not reflect the nature of this encounter. Every attempt is made to correct such errors.  Rex Kras, [place], [facility]  Pager: [phone] Office: [phone]

## 2022-02-24 ENCOUNTER — Other Ambulatory Visit: Payer: Self-pay

## 2022-02-24 ENCOUNTER — Ambulatory Visit: Payer: BLUE CROSS/BLUE SHIELD

## 2022-02-24 DIAGNOSIS — R002 Palpitations: Secondary | ICD-10-CM

## 2022-03-07 ENCOUNTER — Other Ambulatory Visit: Payer: Self-pay

## 2022-03-07 ENCOUNTER — Inpatient Hospital Stay: Payer: BLUE CROSS/BLUE SHIELD

## 2022-03-07 ENCOUNTER — Ambulatory Visit: Payer: BLUE CROSS/BLUE SHIELD

## 2022-03-07 DIAGNOSIS — R002 Palpitations: Secondary | ICD-10-CM

## 2022-04-02 ENCOUNTER — Ambulatory Visit: Payer: BLUE CROSS/BLUE SHIELD | Admitting: Cardiology

## 2022-05-30 NOTE — Progress Notes (Signed)
Called pt, no answer. Left vm requesting call back?

## 2022-06-02 NOTE — Progress Notes (Signed)
Called pt, no answer. Left vm requesting call back?

## 2022-06-03 NOTE — Progress Notes (Signed)
Third attempt to contact pt, no answer. Unable to leave vm requesting call back. No attempt has been made to contact us at this time.

## 2022-08-30 ENCOUNTER — Telehealth: Payer: Self-pay | Admitting: Family Medicine

## 2022-08-30 DIAGNOSIS — R21 Rash and other nonspecific skin eruption: Secondary | ICD-10-CM

## 2022-08-30 MED ORDER — TRIAMCINOLONE ACETONIDE 0.1 % EX CREA: 1.0000 | TOPICAL_CREAM | Freq: Two times a day (BID) | CUTANEOUS | 0 refills | Status: AC

## 2022-08-30 MED ORDER — PREDNISONE 10 MG (21) PO TBPK: ORAL_TABLET | ORAL | 0 refills | Status: DC

## 2022-08-30 NOTE — Patient Instructions (Signed)
Rash, Adult A rash is a change in the color of your skin. A rash can also change the way your skin feels. There are many different conditions and factors that can cause a rash. Some rashes may disappear after a few days, but some may last for a few weeks. Common causes of rashes include: Viral infections, such as: Colds. Measles. Hand, foot, and mouth disease. Bacterial infections, such as: Scarlet fever. Impetigo. Fungal infections, such as Candida. Allergic reactions to food, medicines, or skin care products. Follow these instructions at home: The goal of treatment is to stop the itching and keep the rash from spreading. Pay attention to any changes in your symptoms. Follow these instructions to help with your condition: Medicine Take or apply over-the-counter and prescription medicines only as told by your health care provider. These may include: Corticosteroid creams to treat red or swollen skin. Anti-itch lotions. Oral allergy medicines (antihistamines). Oral corticosteroids for severe symptoms.  Skin care Apply cool compresses to the affected areas. Do not scratch or rub your skin. Avoid covering the rash. Make sure the rash is exposed to air as much as possible. Managing itching and discomfort Avoid hot showers or baths, which can make itching worse. A cold shower may help. Try taking a bath with: Epsom salts. Follow manufacturer instructions on the packaging. You can get these at your local pharmacy or grocery store. Baking soda. Pour a small amount into the bath as told by your health care provider. Colloidal oatmeal. Follow manufacturer instructions on the packaging. You can get this at your local pharmacy or grocery store. Try applying baking soda paste to your skin. Stir water into baking soda until it reaches a paste-like consistency. Try applying calamine lotion. This is an over-the-counter lotion that helps to relieve itchiness. Keep cool and out of the sun. Sweating  and being hot can make itching worse. General instructions  Rest as needed. Drink enough fluid to keep your urine pale yellow. Wear loose-fitting clothing. Avoid scented soaps, detergents, and perfumes. Use gentle soaps, detergents, perfumes, and other cosmetic products. Avoid any substance that causes your rash. Keep a journal to help track what causes your rash. Write down: What you eat. What cosmetic products you use. What you drink. What you wear. This includes jewelry. Keep all follow-up visits as told by your health care provider. This is important. Contact a health care provider if: You sweat at night. You lose weight. You urinate more than normal. You urinate less than normal, or you notice that your urine is a darker color than usual. You feel weak. You vomit. Your skin or the whites of your eyes look yellow (jaundice). Your skin: Tingles. Is numb. Your rash: Does not go away after several days. Gets worse. You are: Unusually thirsty. More tired than normal. You have: New symptoms. Pain in your abdomen. A fever. Diarrhea. Get help right away if you: Have a fever and your symptoms suddenly get worse. Develop confusion. Have a severe headache or a stiff neck. Have severe joint pains or stiffness. Have a seizure. Develop a rash that covers all or most of your body. The rash may or may not be painful. Develop blisters that: Are on top of the rash. Grow larger or grow together. Are painful. Are inside your nose or mouth. Develop a rash that: Looks like purple pinprick-sized spots all over your body. Has a "bull's eye" or looks like a target. Is not related to sun exposure, is red and painful, and causes   your skin to peel. Summary A rash is a change in the color of your skin. Some rashes disappear after a few days, but some may last for a few weeks. The goal of treatment is to stop the itching and keep the rash from spreading. Take or apply over-the-counter  and prescription medicines only as told by your health care provider. Contact a health care provider if you have new or worsening symptoms. Keep all follow-up visits as told by your health care provider. This is important. This information is not intended to replace advice given to you by your health care provider. Make sure you discuss any questions you have with your health care provider. Document Revised: 09/19/2021 Document Reviewed: 09/19/2021 Elsevier Patient Education  2023 Elsevier Inc.  

## 2022-08-30 NOTE — Progress Notes (Signed)
Virtual Visit Consent   Jeffrey Henderson, you are scheduled for a virtual visit with a [facility] provider today. Just as with appointments in the office, your consent must be obtained to participate. Your consent will be active for this visit and any virtual visit you may have with one of our providers in the next 365 days. If you have a MyChart account, a copy of this consent can be sent to you electronically.  As this is a virtual visit, video technology does not allow for your provider to perform a traditional examination. This may limit your provider's ability to fully assess your condition. If your provider identifies any concerns that need to be evaluated in person or the need to arrange testing (such as labs, EKG, etc.), we will make arrangements to do so. Although advances in technology are sophisticated, we cannot ensure that it will always work on either your end or our end. If the connection with a video visit is poor, the visit may have to be switched to a telephone visit. With either a video or telephone visit, we are not always able to ensure that we have a secure connection.  By engaging in this virtual visit, you consent to the provision of healthcare and authorize for your insurance to be billed (if applicable) for the services provided during this visit. Depending on your insurance coverage, you may receive a charge related to this service.  I need to obtain your verbal consent now. Are you willing to proceed with your visit today? Jeffrey Henderson has provided verbal consent on 08/30/2022 for a virtual visit (video or telephone). Georgana Curio, FNP  Date: 08/30/2022 12:44 PM  Virtual Visit via Video Note   I, Georgana Curio, connected with  Jeffrey Henderson  ([id number], 11-Jun-1966) on 08/30/22 at 12:45 PM EDT by a video-enabled telemedicine application and verified that I am speaking with the correct person using two identifiers.  Location: Patient: Virtual Visit Location Patient:  Home Provider: Virtual Visit Location Provider: Home Office   I discussed the limitations of evaluation and management by telemedicine and the availability of in person appointments. The patient expressed understanding and agreed to proceed.    History of Present Illness: Jeffrey Henderson is a 56 y.o. who identifies as a male who was assigned male at birth, and is being seen today for rash to LLE that itches.[place] He doesn't know what he came into contact with. Bumps on LLE. Possible grass mites.  HPI: HPI  Problems:  Patient Active Problem List   Diagnosis Date Noted   Hiatal hernia 03/21/2016   Schatzki's ring of distal esophagus 03/21/2016   Diverticulosis of sigmoid colon 03/21/2016   Low back pain 02/07/2015   Neck pain 02/07/2015   Eosinophilic esophagitis 03/21/2010    Allergies:  Allergies  Allergen Reactions   Codeine Palpitations   Medications:  Current Outpatient Medications:    predniSONE (STERAPRED UNI-PAK 21 TAB) 10 MG (21) TBPK tablet, Prednisone 10 mg- 6 day dose pack as directed- no refills., Disp: 1 each, Rfl: 0   triamcinolone cream (KENALOG) 0.1 %, Apply 1 Application topically 2 (two) times daily., Disp: 30 g, Rfl: 0   LORazepam (ATIVAN) 1 MG tablet, TAKE ONE TABLET (1 MG DOSE) BY MOUTH DAILY AS NEEDED FOR ANXIETY., Disp: , Rfl: 1   omeprazole (PRILOSEC) 20 MG capsule, Take 20 mg by mouth every morning. , Disp: , Rfl:    Probiotic Product (PROBIOTIC PO),  Take 1 capsule by mouth daily., Disp: , Rfl:    traZODone (DESYREL) 100 MG tablet, TAKE 1 TABLET BY MOUTH EVERYDAY AT BEDTIME, Disp: , Rfl: 1   vitamin B-12 (CYANOCOBALAMIN) 1000 MCG tablet, Take 1,000 mcg by mouth daily., Disp: , Rfl:   Observations/Objective: Patient is well-developed, well-nourished in no acute distress.  Resting comfortably  at home.  Head is normocephalic, atraumatic.  No labored breathing.  Speech is clear and coherent with logical content.  Patient is alert  and oriented at baseline.    Assessment and Plan: 1. Rash  Med use and side effects discussed. Urgent care if sx worsen.   Follow Up Instructions: I discussed the assessment and treatment plan with the patient. The patient was provided an opportunity to ask questions and all were answered. The patient agreed with the plan and demonstrated an understanding of the instructions.  A copy of instructions were sent to the patient via MyChart unless otherwise noted below.     The patient was advised to call back or seek an in-person evaluation if the symptoms worsen or if the condition fails to improve as anticipated.  Time:  I spent 10 minutes with the patient via telehealth technology discussing the above problems/concerns.    Georgana Curio, FNP

## 2023-04-19 IMAGING — CT CT ABD-PELV W/ CM
2 of 5 series · 15 of 46 positions shown, 17 images · IV contrast (APPLIED)
Comparison: April 05, 2019

CLINICAL DATA: Left lower quadrant pain status post colonoscopy.

EXAM:
CT ABDOMEN AND PELVIS WITH CONTRAST
TECHNIQUE: Multidetector CT imaging of the abdomen and pelvis was performed
using the standard protocol following bolus administration of
intravenous contrast.
CONTRAST:  80mL OMNIPAQUE IOHEXOL 350 MG/ML SOLN

[Series 2: abd pel w · axial · 0.69mm/px · z∈[-906,-466]mm · 12 of 102 slices shown, 14 images]
[im 7/102  soft-tissue]
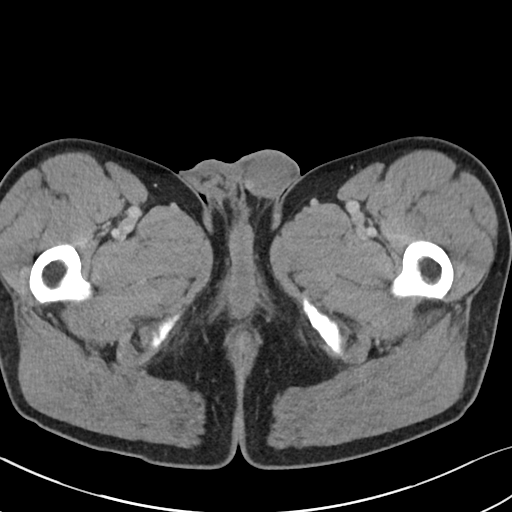
[im 7/102  bone]
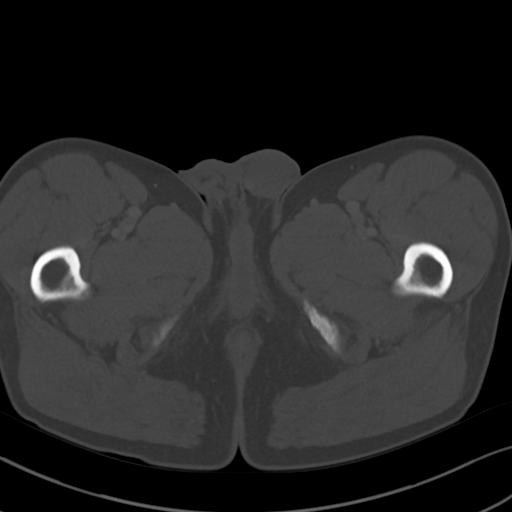
[im 14/102  soft-tissue]
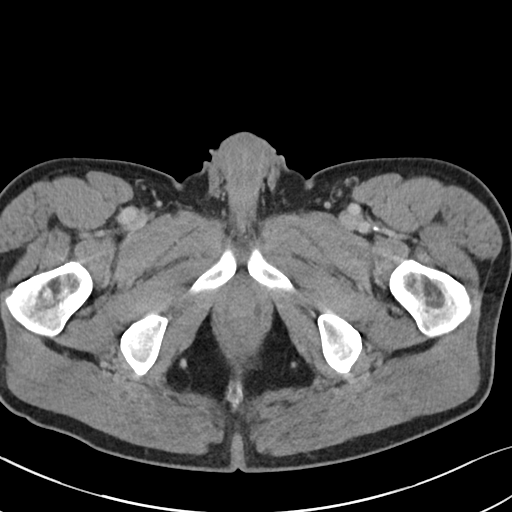
[im 21/102  soft-tissue]
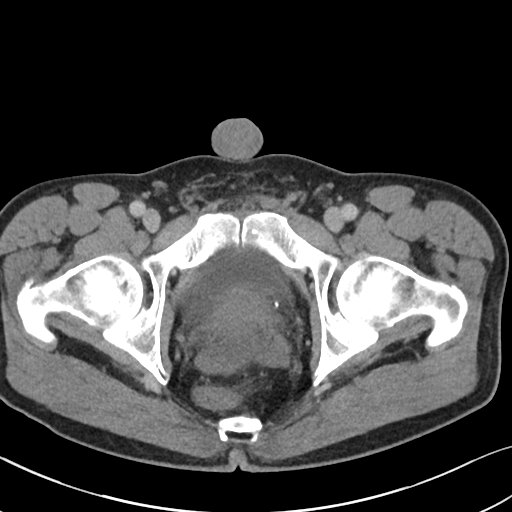
[im 34/102  soft-tissue]
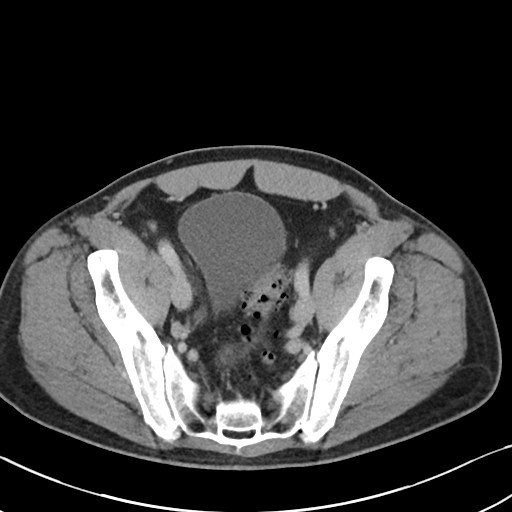
[im 41/102  soft-tissue]
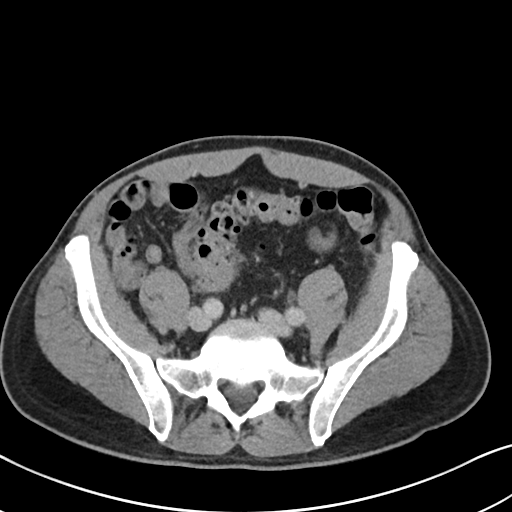
[im 48/102  soft-tissue]
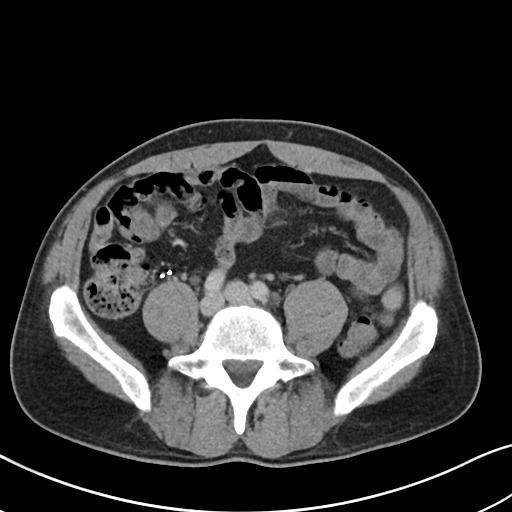
[im 54/102  soft-tissue]
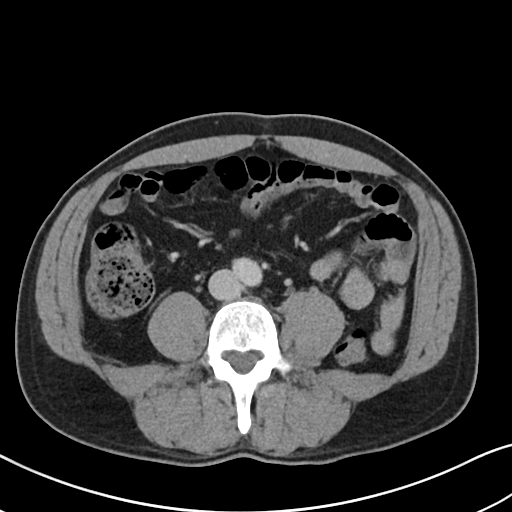
[im 61/102  soft-tissue]
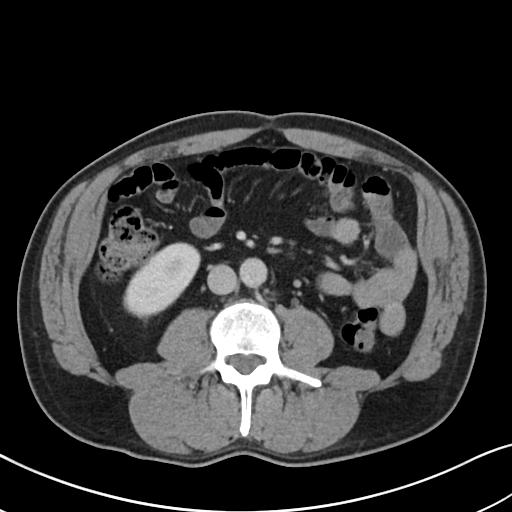
[im 68/102  soft-tissue]
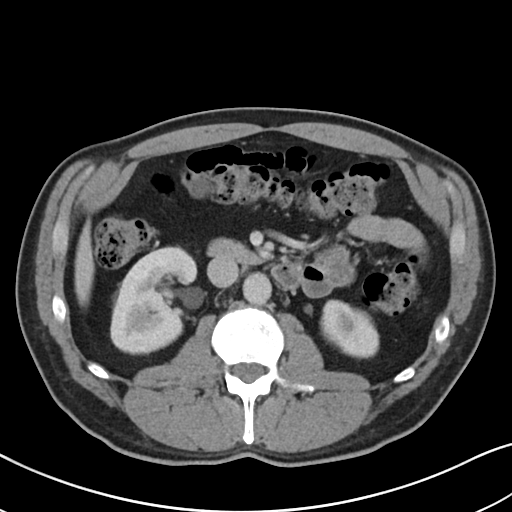
[im 68/102  bone]
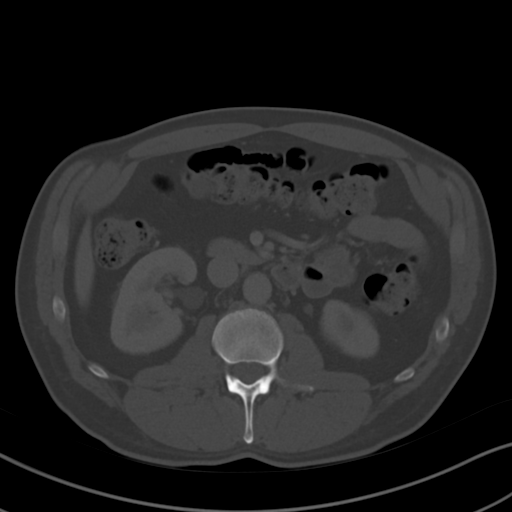
[im 81/102  soft-tissue]
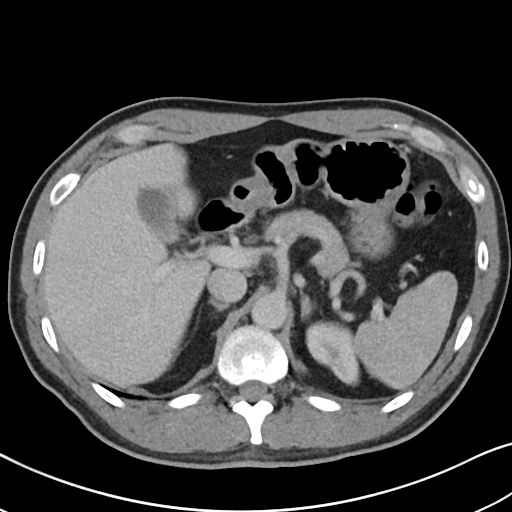
[im 88/102  soft-tissue]
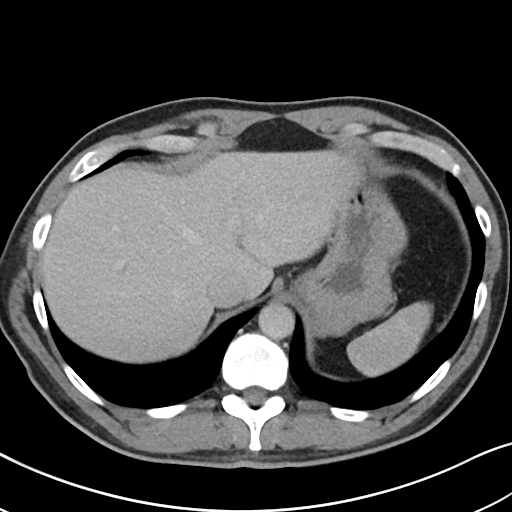
[im 95/102  soft-tissue]
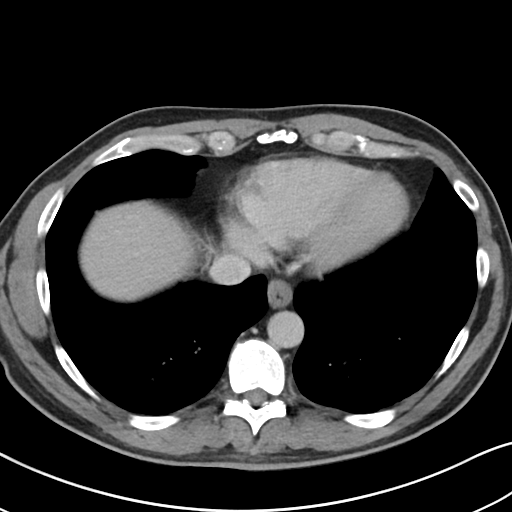

[Series 5: coronal · coronal · 0.71mm/px · 3 of 89 slices shown]
[im 30/89  soft-tissue]
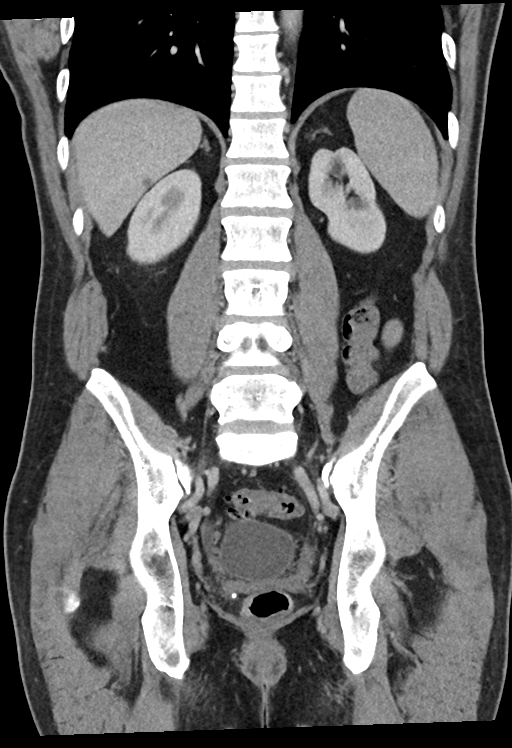
[im 40/89  soft-tissue]
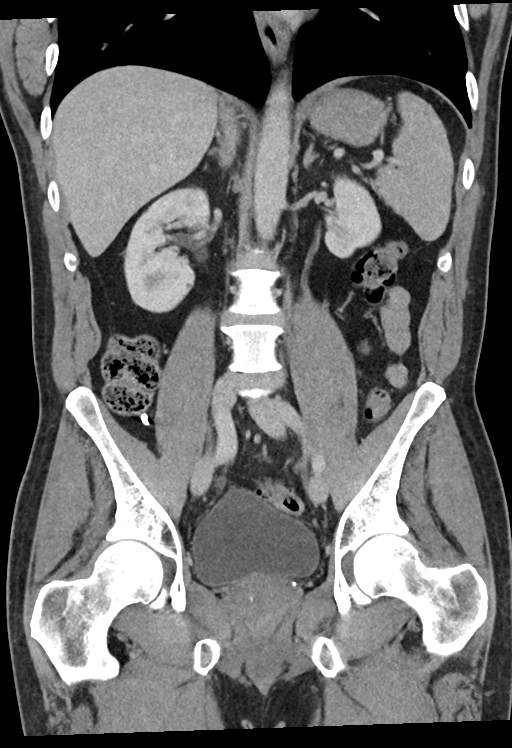
[im 49/89  soft-tissue]
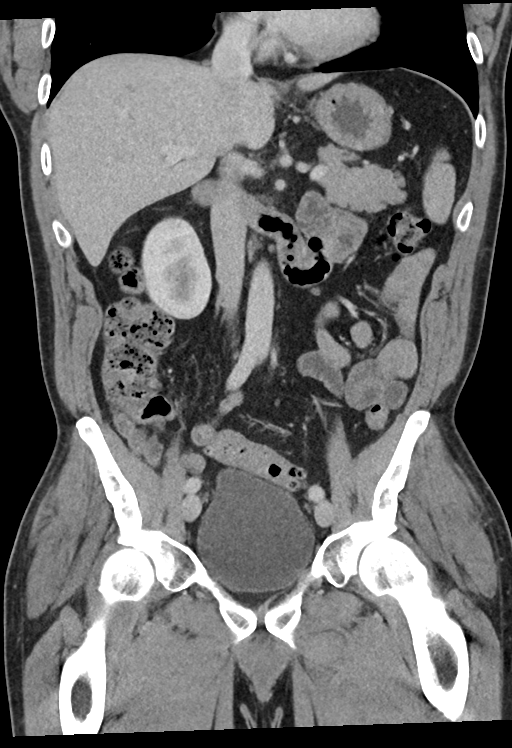

[15 of 46 positions shown; findings below may reference images not displayed]

FINDINGS: Lower chest: No acute abnormality.

Hepatobiliary: 5 mm foci of parenchymal low attenuation are seen
within the right lobe of the liver (axial CT image 12, CT series 2).
These are too small to characterize by CT examination. An additional
9 mm focus of low attenuation is seen within the posteromedial
aspect of the right lobe. No gallstones, gallbladder wall
thickening, or biliary dilatation.

Pancreas: Unremarkable. No pancreatic ductal dilatation or
surrounding inflammatory changes.

Spleen: Normal in size without focal abnormality.

Adrenals/Urinary Tract: Adrenal glands are unremarkable. Kidneys are
normal, without renal calculi, focal lesion, or hydronephrosis.
Bladder is unremarkable.

Stomach/Bowel: Stomach is within normal limits. Appendix appears
normal. No evidence of bowel wall thickening, distention, or
inflammatory changes. Noninflamed diverticula are seen throughout
the sigmoid colon.

Vascular/Lymphatic: No significant vascular findings are present. No
enlarged abdominal or pelvic lymph nodes.

Reproductive: The prostate gland is mildly enlarged.

Other: No abdominal wall hernia or abnormality. No abdominopelvic
ascites.

Musculoskeletal: No acute or significant osseous findings.
IMPRESSION: 1. Sigmoid diverticulosis.
2. Small hepatic cysts versus hemangiomas. Correlation with
nonemergent hepatic ultrasound is recommended.

## 2024-08-08 ENCOUNTER — Telehealth: Admitting: Physician Assistant

## 2024-08-08 DIAGNOSIS — K5909 Other constipation: Secondary | ICD-10-CM

## 2024-08-08 NOTE — Progress Notes (Signed)
  Because of chronic constipation, I feel your condition warrants further evaluation and I recommend that you be seen in a face-to-face visit.   NOTE: There will be NO CHARGE for this E-Visit   If you are having a true medical emergency, please call 911.     For an urgent face to face visit, [place] has multiple urgent care centers for your convenience.  Click the link below for the full list of locations and hours, walk-in wait times, appointment scheduling options and driving directions:  Urgent Care - [place], [place], [place], [place], [place], [place]  [place]     Your MyChart E-visit questionnaire answers were reviewed by a board certified advanced clinical practitioner to complete your personal care plan based on your specific symptoms.    Thank you for using e-Visits.     I have spent 5 minutes in review of e-visit questionnaire, review and updating patient chart, medical decision making and response to patient.   Delon CHRISTELLA Dickinson, PA-C

## 2024-08-09 ENCOUNTER — Ambulatory Visit: Admitting: Sports Medicine

## 2024-08-09 ENCOUNTER — Other Ambulatory Visit: Payer: Self-pay

## 2024-08-09 VITALS — BP 110/74 | Ht 71.0 in | Wt 200.0 lb

## 2024-08-09 DIAGNOSIS — M25512 Pain in left shoulder: Secondary | ICD-10-CM

## 2024-08-09 DIAGNOSIS — M19012 Primary osteoarthritis, left shoulder: Secondary | ICD-10-CM | POA: Diagnosis not present

## 2024-08-09 NOTE — Progress Notes (Addendum)
 PCP: Pura Lenis, MD  Subjective:   HPI: Patient is a 58 y.o. male here for left shoulder pain.  Patient states 3 weeks ago he was bench pressing and had discomfort.  He denies any significantly popping or injury to the shoulder.  He denies any previous surgery to the shoulder.  He states the pain is around the top of the shoulder, and he initially had difficulty with abduction of the shoulder but this is improved.  He is training for an Iron[employer] next April and has taken the last 3 weeks completely off of any arm or shoulder related movements.  He has continued running without difficulty.  Past Medical History:  Diagnosis Date   Asthma    Childhood   Diverticulosis of sigmoid colon 03/21/2016   Eosinophilic esophagitis 03/21/2010   [facility].  Treated with oral fluticasone therapy      GAD (generalized anxiety disorder)    GERD (gastroesophageal reflux disease)    Hiatal hernia 03/21/2016   History of appendicitis    History of constipation    History of inguinal hernia    Left?   History of kidney stones    Hydrocele    Moderate right and small left-sided hydrocele   Liver cyst 06/2017   Bilateral, 2.4 cm cyst right lobe of the liver , noted on CT Abd   Migraines    occ   Schatzki's ring of distal esophagus 03/21/2016   Wears glasses    reading    Current Outpatient Medications on File Prior to Visit  Medication Sig Dispense Refill   LORazepam (ATIVAN) 1 MG tablet TAKE ONE TABLET (1 MG DOSE) BY MOUTH DAILY AS NEEDED FOR ANXIETY.  1   omeprazole (PRILOSEC) 20 MG capsule Take 20 mg by mouth every morning.      Probiotic Product (PROBIOTIC PO) Take 1 capsule by mouth daily.     traZODone (DESYREL) 100 MG tablet TAKE 1 TABLET BY MOUTH EVERYDAY AT BEDTIME  1   triamcinolone  cream (KENALOG ) 0.1 % Apply 1 Application topically 2 (two) times daily. 30 g 0   vitamin B-12 (CYANOCOBALAMIN) 1000 MCG tablet Take 1,000 mcg by mouth daily.     No current facility-administered medications on  file prior to visit.    BP 110/74   Ht 5' 11 (1.803 m)   Wt 200 lb (90.7 kg)   BMI 27.89 kg/m        Objective:   Physical Exam:  Gen: NAD, comfortable in exam room Left shoulder Inspection: No effusion, erythema, warmth, or edema Palpation: Mild tenderness palpation over the [facility] joint, as well as biceps tendon ROM: Full flexion, full abduction without pain Special Tests: Negative empty can, negative Yergason's, negative speeds, negative Neer's, negative Hawkins, positive clunk test, positive cross arm test, positive O'Brien's Neuro: Sensation intact bilaterally, strength intact and equal bilaterally  [facility] Complete US  of Shoulder Date: 08/09/2024 Location: Left shoulder Indication: Left shoulder pain Findings: Patient was seated on exam table and shoulder US  examination was performed using high frequency linear probe.  - Biceps tendon was visualized within the bicipital groove in both longitudinal and transverse axis with no signs of fluid or hypoechoic changes. Tendon fibers intact without signs of irregularity.  - Subscapularis tendon was visualized in both longitudinal and transverse axis with intact tendon inserting at the inferior lesser tubercle of the humerus. No signs of tear, no hypoechoic changes or tissue irregularity were seen.  -  Supraspinatus tendon was visualized in longitudinal, transverse, and dynamic  views. No signs of tear with the tendon inserting at the superior facet of the greater tubercle of the humerus, no hypoechoic changes or tissue irregularity seen.  - Infraspinatus and teres minor tendons were visualized in both longitudinal and transverse axis with tendon insertion at the middle facet of the great tubercle of the humerus with no signs of tearing, hypoechoic changes or tissue irregularity seen.  - Posterior glenohumeral joint was visualized with proper alignment.  Labrum visualized without any significant signs of hypoechoic change - AC Joint was  visualized with bursal distension and signs of arthritic change  IMPRESSION:  AC joint bursitis with arthritic changes present   Ultrasound and interpretation by Dr. Jilda and Helene NOVAK. Fields, MD  Assessment/Plan:   SATCHEL HEIDINGER is a 58 y.o. male who was seen today for the following: 1. Acute pain of left shoulder (Primary) 2. Arthritis of left acromioclavicular joint - US  COMPLETE JOINT SPACE STRUCTURE UP LEFT; Future - Reassured patient that his rotator cuff tendons, are intact without any injury along with his biceps tendon - Recommending Voltaren gel 3-4 times a day right at the [facility] joint - Patient can return to lifting as long as the lifts do not have any pain associated with them as well as starting light - Recommend he avoid flies, military press and LAT pull downs - He can return to the gym immediately with those recommendations - Can swim and ice as needed -   Follow-up/Education:   Return if symptoms worsen or fail to improve.   May return sooner as needed and encouraged to call/e-mail for additional questions or  worsening symptoms in the interim.  Krystal Jilda, DO Sports Medicine Fellow 08/09/2024 12:47 PM
